# Patient Record
Sex: Female | Born: 1935 | Race: White | Hispanic: No | Smoking: Never smoker
Health system: Southern US, Community
[De-identification: ages and names within clinical notes are randomized; demographics above are authoritative.]

## PROBLEM LIST (undated history)

## (undated) DIAGNOSIS — I10 Essential (primary) hypertension: Secondary | ICD-10-CM

## (undated) DIAGNOSIS — E46 Unspecified protein-calorie malnutrition: Secondary | ICD-10-CM

## (undated) DIAGNOSIS — Z66 Do not resuscitate: Secondary | ICD-10-CM

## (undated) DIAGNOSIS — I639 Cerebral infarction, unspecified: Secondary | ICD-10-CM

## (undated) DIAGNOSIS — I4891 Unspecified atrial fibrillation: Secondary | ICD-10-CM

## (undated) HISTORY — PX: PEG TUBE PLACEMENT: SUR1034

---

## 2013-03-11 ENCOUNTER — Other Ambulatory Visit (HOSPITAL_COMMUNITY): Payer: Self-pay | Admitting: Geriatric Medicine

## 2013-03-11 DIAGNOSIS — R1310 Dysphagia, unspecified: Secondary | ICD-10-CM

## 2013-03-14 ENCOUNTER — Ambulatory Visit (HOSPITAL_COMMUNITY)
Admission: RE | Admit: 2013-03-14 | Discharge: 2013-03-14 | Disposition: A | Discharge status: Home / Self Care | Payer: Medicare Other | Source: Ambulatory Visit | Attending: Interventional Radiology | Admitting: Interventional Radiology

## 2013-03-14 ENCOUNTER — Other Ambulatory Visit (HOSPITAL_COMMUNITY): Payer: Self-pay | Admitting: Interventional Radiology

## 2013-03-14 ENCOUNTER — Other Ambulatory Visit: Payer: Self-pay | Admitting: Geriatric Medicine

## 2013-03-14 DIAGNOSIS — R4701 Aphasia: Secondary | ICD-10-CM

## 2013-03-14 DIAGNOSIS — Z431 Encounter for attention to gastrostomy: Secondary | ICD-10-CM | POA: Insufficient documentation

## 2013-03-14 MED ORDER — IOHEXOL 300 MG/ML  SOLN
50.0000 mL | Freq: Once | INTRAMUSCULAR | Status: AC | PRN
  Administered 2013-03-14: 1 mL

## 2013-03-15 ENCOUNTER — Ambulatory Visit (HOSPITAL_COMMUNITY)
Admission: RE | Admit: 2013-03-15 | Discharge: 2013-03-15 | Disposition: A | Discharge status: Home / Self Care | Payer: Medicare Other | Source: Ambulatory Visit | Attending: Geriatric Medicine | Admitting: Geriatric Medicine

## 2013-03-15 DIAGNOSIS — R1310 Dysphagia, unspecified: Secondary | ICD-10-CM

## 2013-03-15 DIAGNOSIS — Z8673 Personal history of transient ischemic attack (TIA), and cerebral infarction without residual deficits: Secondary | ICD-10-CM | POA: Insufficient documentation

## 2013-03-15 DIAGNOSIS — R1313 Dysphagia, pharyngeal phase: Secondary | ICD-10-CM | POA: Insufficient documentation

## 2013-03-15 DIAGNOSIS — R634 Abnormal weight loss: Secondary | ICD-10-CM | POA: Insufficient documentation

## 2013-03-15 DIAGNOSIS — F458 Other somatoform disorders: Secondary | ICD-10-CM | POA: Insufficient documentation

## 2013-03-15 DIAGNOSIS — K219 Gastro-esophageal reflux disease without esophagitis: Secondary | ICD-10-CM | POA: Insufficient documentation

## 2013-03-15 DIAGNOSIS — R1311 Dysphagia, oral phase: Secondary | ICD-10-CM | POA: Insufficient documentation

## 2013-03-15 NOTE — Procedures (Signed)
Objective Swallowing Evaluation: Modified Barium Swallowing Study  Patient Details  Name: Tamara Bailey MRN: [id number] Date of Birth: [birth year]  Today's Date: 03/15/2013 Time: 1100-1200 SLP Time Calculation (min): 60 min  Past Medical History: No past medical history on file. Past Surgical History: No past surgical history on file. HPI:  80 year old female referred for outpatient MBS due to difficulty swallowing.  Pt reports "I don't swallow good".  PMH significant for CVA, voice changes, vocal fold implants, PEG tube placement, reflux, weight loss, globus sensation.     Assessment / Plan / Recommendation Clinical Impression  Dysphagia Diagnosis: Severe pharyngeal phase dysphagia;Mild oral phase dysphagia;Severe cervical esophageal phase dysphagia Clinical impression: Mild oral, severe pharyngeal and cervical esophageal dysphagia, with both sensory and motor based components.  Pt oral phase is impaired only due to excessive saliva production, which consequently has an adverse impact on pharyngeal swallow.  Pt demonstrates delayed swallow reflex and minimal pharyngeal movement, regardless of consistency, resulting in significant residue throughout the pharynx.  There is markedly reduced CP relaxation, resulting in a minimal amount of bolus passing through to the esophagus.  Pt is then observed to penetrate and aspirate residue, and subsequently bring almost the entire bolus back up to the oral cavity.  Further compromising pt safety is the fact that her vocal fold adduction is inadequate for airway protection.  Per pt, a vocal fold implant was done a couple of years ago, and failed.  Suction was used to assist pt to remove oropharyngeal residue.  At this point, pt is NOT safe for po intake, and is recommended to use PEG tube for all nutrition and hydration.  Pt has lost weight, and so was encouraged to meet with MD or dietician to clarify tube feeding needs to avoid further weight loss.  Will  recommend SLP to follow up at facility to provide education, provide trial of strengthening exercises, and monitor any progress for appropriateness to repeat objective study.  At such time as objective study is warranted, FEES is recommended, given impairment of vocal fold function.    Treatment Recommendation  Defer treatment plan to SLP at (Comment) (pt's facility)    Diet Recommendation NPO;Alternative means - long-term   Medication Administration: Via alternative means    Other  Recommendations Oral Care Recommendations: Oral care Q4 per protocol Other Recommendations: Have oral suction available   Follow Up Recommendations  Outpatient SLP;Home health SLP (ST services recommended at pt's facility)    Frequency and Duration  per primary SLP     Pertinent Vitals/Pain VSS, no pain reported    SLP Swallow Goals     General Date of Onset: 03/15/13 HPI: 80 year old female referred for outpatient MBS due to difficulty swallowing.  Pt reports "I don't swallow good".  PMH significant for CVA, voice changes, vocal fold implants, PEG tube placement, reflux, weight loss, globus sensation. Type of Study: Modified Barium Swallowing Study Reason for Referral: Objectively evaluate swallowing function Previous Swallow Assessment: none found Diet Prior to this Study: Dysphagia 1 (puree);Thin liquids Temperature Spikes Noted: No Respiratory Status: Room air History of Recent Intubation: No Behavior/Cognition: Alert;Cooperative;Pleasant mood Oral Motor / Sensory Function: Within functional limits Self-Feeding Abilities: Needs assist Patient Positioning: Upright in chair Baseline Vocal Quality: Hoarse;Low vocal intensity Volitional Cough: Weak Volitional Swallow: Able to elicit Anatomy: Within functional limits Pharyngeal Secretions: Not observed secondary MBS    Reason for Referral Objectively evaluate swallowing function   Oral Phase Oral Preparation/Oral Phase Oral Phase: [facility]  Pharyngeal Phase Pharyngeal Phase Pharyngeal Phase: Impaired Pharyngeal - Honey Pharyngeal - Honey Teaspoon: Delayed swallow initiation;Premature spillage to valleculae;Reduced pharyngeal peristalsis;Reduced epiglottic inversion;Reduced anterior laryngeal mobility;Reduced laryngeal elevation;Reduced airway/laryngeal closure;Reduced tongue base retraction;Penetration/Aspiration during swallow;Penetration/Aspiration after swallow;Pharyngeal residue - valleculae;Pharyngeal residue - pyriform sinuses;Pharyngeal residue - posterior pharnyx Penetration/Aspiration details (honey teaspoon): Material enters airway, passes BELOW cords and not ejected out despite cough attempt by patient Pharyngeal - Thin Pharyngeal - Thin Teaspoon: Delayed swallow initiation;Premature spillage to valleculae;Reduced pharyngeal peristalsis;Reduced epiglottic inversion;Reduced anterior laryngeal mobility;Reduced laryngeal elevation;Reduced airway/laryngeal closure;Penetration/Aspiration after swallow;Penetration/Aspiration during swallow;Penetration/Aspiration before swallow;Reduced tongue base retraction;Pharyngeal residue - valleculae;Pharyngeal residue - pyriform sinuses;Pharyngeal residue - posterior pharnyx;Lateral channel residue Penetration/Aspiration details (thin teaspoon): Material enters airway, passes BELOW cords and not ejected out despite cough attempt by patient Pharyngeal - Solids Pharyngeal - Puree: Delayed swallow initiation;Premature spillage to valleculae;Reduced pharyngeal peristalsis;Reduced epiglottic inversion;Reduced anterior laryngeal mobility;Reduced laryngeal elevation;Reduced tongue base retraction;Pharyngeal residue - valleculae;Pharyngeal residue - pyriform sinuses;Reduced airway/laryngeal closure;Penetration/Aspiration after swallow;Pharyngeal residue - posterior pharnyx;Pharyngeal residue - cp segment;Inter-arytenoid space residue;Lateral channel residue Penetration/Aspiration details (puree):  Material enters airway, CONTACTS cords and not ejected out  Cervical Esophageal Phase    GO    Cervical Esophageal Phase Cervical Esophageal Phase: Impaired Cervical Esophageal Phase - Honey Honey Teaspoon: Prominent cricopharyngeal segment;Reduced cricopharyngeal relaxation Cervical Esophageal Phase - Thin Thin Teaspoon: Prominent cricopharyngeal segment;Reduced cricopharyngeal relaxation Cervical Esophageal Phase - Solids Puree: Prominent cricopharyngeal segment;Reduced cricopharyngeal relaxation        Tamara Bailey Tamara Bailey, Ccala CorpMSP, CCC-SLP [phone]  Leigh AuroraBueche, Tamara Bailey 03/15/2013, 12:38 PM

## 2013-03-16 ENCOUNTER — Other Ambulatory Visit: Payer: Self-pay | Admitting: Geriatric Medicine

## 2013-03-16 DIAGNOSIS — R4701 Aphasia: Secondary | ICD-10-CM

## 2013-03-17 ENCOUNTER — Other Ambulatory Visit (HOSPITAL_COMMUNITY): Payer: Self-pay | Admitting: Interventional Radiology

## 2013-03-22 ENCOUNTER — Other Ambulatory Visit: Payer: Self-pay | Admitting: Geriatric Medicine

## 2013-08-12 ENCOUNTER — Emergency Department (HOSPITAL_COMMUNITY): Payer: Medicare Other

## 2013-08-12 ENCOUNTER — Encounter (HOSPITAL_COMMUNITY): Payer: Self-pay | Admitting: Emergency Medicine

## 2013-08-12 ENCOUNTER — Inpatient Hospital Stay (HOSPITAL_COMMUNITY)
Admission: EM | Admit: 2013-08-12 | Discharge: 2013-08-16 | DRG: 480 | Disposition: A | Discharge status: Skilled Nursing Facility | Payer: Medicare Other | Attending: Internal Medicine | Admitting: Internal Medicine

## 2013-08-12 DIAGNOSIS — E43 Unspecified severe protein-calorie malnutrition: Secondary | ICD-10-CM | POA: Diagnosis present

## 2013-08-12 DIAGNOSIS — Z9221 Personal history of antineoplastic chemotherapy: Secondary | ICD-10-CM

## 2013-08-12 DIAGNOSIS — S72009A Fracture of unspecified part of neck of unspecified femur, initial encounter for closed fracture: Secondary | ICD-10-CM

## 2013-08-12 DIAGNOSIS — Y9210 Unspecified residential institution as the place of occurrence of the external cause: Secondary | ICD-10-CM | POA: Diagnosis present

## 2013-08-12 DIAGNOSIS — I482 Chronic atrial fibrillation, unspecified: Secondary | ICD-10-CM

## 2013-08-12 DIAGNOSIS — Z7901 Long term (current) use of anticoagulants: Secondary | ICD-10-CM

## 2013-08-12 DIAGNOSIS — I10 Essential (primary) hypertension: Secondary | ICD-10-CM | POA: Diagnosis present

## 2013-08-12 DIAGNOSIS — F99 Mental disorder, not otherwise specified: Secondary | ICD-10-CM | POA: Diagnosis not present

## 2013-08-12 DIAGNOSIS — R1310 Dysphagia, unspecified: Secondary | ICD-10-CM | POA: Diagnosis present

## 2013-08-12 DIAGNOSIS — Z681 Body mass index (BMI) 19 or less, adult: Secondary | ICD-10-CM

## 2013-08-12 DIAGNOSIS — E039 Hypothyroidism, unspecified: Secondary | ICD-10-CM | POA: Diagnosis present

## 2013-08-12 DIAGNOSIS — S72143A Displaced intertrochanteric fracture of unspecified femur, initial encounter for closed fracture: Principal | ICD-10-CM | POA: Diagnosis present

## 2013-08-12 DIAGNOSIS — Z931 Gastrostomy status: Secondary | ICD-10-CM

## 2013-08-12 DIAGNOSIS — D62 Acute posthemorrhagic anemia: Secondary | ICD-10-CM | POA: Diagnosis not present

## 2013-08-12 DIAGNOSIS — S72002A Fracture of unspecified part of neck of left femur, initial encounter for closed fracture: Secondary | ICD-10-CM

## 2013-08-12 DIAGNOSIS — I4891 Unspecified atrial fibrillation: Secondary | ICD-10-CM | POA: Diagnosis present

## 2013-08-12 DIAGNOSIS — Z853 Personal history of malignant neoplasm of breast: Secondary | ICD-10-CM

## 2013-08-12 DIAGNOSIS — Z885 Allergy status to narcotic agent status: Secondary | ICD-10-CM

## 2013-08-12 DIAGNOSIS — J3800 Paralysis of vocal cords and larynx, unspecified: Secondary | ICD-10-CM | POA: Diagnosis present

## 2013-08-12 DIAGNOSIS — Z8673 Personal history of transient ischemic attack (TIA), and cerebral infarction without residual deficits: Secondary | ICD-10-CM

## 2013-08-12 DIAGNOSIS — R64 Cachexia: Secondary | ICD-10-CM | POA: Diagnosis present

## 2013-08-12 DIAGNOSIS — Z79899 Other long term (current) drug therapy: Secondary | ICD-10-CM

## 2013-08-12 DIAGNOSIS — W19XXXA Unspecified fall, initial encounter: Secondary | ICD-10-CM

## 2013-08-12 DIAGNOSIS — S72002S Fracture of unspecified part of neck of left femur, sequela: Secondary | ICD-10-CM

## 2013-08-12 DIAGNOSIS — W010XXA Fall on same level from slipping, tripping and stumbling without subsequent striking against object, initial encounter: Secondary | ICD-10-CM | POA: Diagnosis present

## 2013-08-12 DIAGNOSIS — Z833 Family history of diabetes mellitus: Secondary | ICD-10-CM

## 2013-08-12 HISTORY — DX: Essential (primary) hypertension: I10

## 2013-08-12 HISTORY — DX: Unspecified atrial fibrillation: I48.91

## 2013-08-12 HISTORY — DX: Cerebral infarction, unspecified: I63.9

## 2013-08-12 MED ORDER — ACETAMINOPHEN 325 MG PO TABS: 650.0000 mg | ORAL_TABLET | Freq: Once | ORAL | Status: DC

## 2013-08-12 MED ORDER — SODIUM CHLORIDE 0.9 % IV SOLN
INTRAVENOUS | Status: DC
  Administered 2013-08-12: 125 mL/h via INTRAVENOUS
  Administered 2013-08-13 (×2): via INTRAVENOUS

## 2013-08-12 MED ORDER — FENTANYL CITRATE 0.05 MG/ML IJ SOLN
25.0000 ug | Freq: Once | INTRAMUSCULAR | Status: AC
  Administered 2013-08-12: 25 ug via INTRAVENOUS
  Filled 2013-08-12: qty 2

## 2013-08-12 MED ORDER — ACETAMINOPHEN 160 MG/5ML PO SOLN: 650.0000 mg | Freq: Once | ORAL | Status: DC

## 2013-08-12 NOTE — ED Notes (Signed)
[facility] EMS[facility] pt sts she tripped and fell, denies hitting her head, pt c/o L hip pain upon movement. A&Ox4. No deformity or crepitus. Minor pain to palpation. Pt on warfarin.

## 2013-08-12 NOTE — ED Provider Notes (Signed)
CSN: [id number]     Arrival date & time 08/12/13  2255 History   First MD Initiated Contact with Patient 08/12/13 2301     Chief Complaint  Patient presents with  . Fall  . Hip Pain     (Consider location/radiation/quality/duration/timing/severity/associated sxs/prior Treatment) HPI Comments: 80 year old female from nursing home presents after mechanical trip and fall causing her to hit her left hip. Patient is transferred down and missed part of the chair falling on her left side. Patient denies head injury and recalls details of events. Patient had a large sneeze prior to fall.  Patient is on Coumadin for long-term atrial fibrillation. No other injuries. Patient has significant pain with any range of motion. No history of hip fracture. Patient denies chest pain, lightheadedness, syncope or other new symptoms. Patient normally walks with a walker  Patient is a 80 y.o. female presenting with fall and hip pain. The history is provided by the patient and the EMS personnel.  Fall Pertinent negatives include no chest pain, no abdominal pain, no headaches and no shortness of breath.  Hip Pain Pertinent negatives include no chest pain, no abdominal pain, no headaches and no shortness of breath.    Past Medical History  Diagnosis Date  . Hypertension   . Stroke   . Atrial fibrillation    History reviewed. No pertinent past surgical history. No family history on file. History  Substance Use Topics  . Smoking status: Never Smoker   . Smokeless tobacco: Not on file  . Alcohol Use: No   OB History   Grav Para Term Preterm Abortions TAB SAB Ect Mult Living                 Review of Systems  Constitutional: Negative for fever and chills.  HENT: Negative for congestion.   Eyes: Negative for visual disturbance.  Respiratory: Negative for shortness of breath.   Cardiovascular: Negative for chest pain.  Gastrointestinal: Negative for vomiting and abdominal pain.  Genitourinary: Negative  for dysuria and flank pain.  Musculoskeletal: Positive for arthralgias and gait problem. Negative for back pain, neck pain and neck stiffness.  Skin: Negative for rash.  Neurological: Negative for light-headedness and headaches.      Allergies  Morphine and related  Home Medications   Prior to Admission medications   Not on File   BP 141/81  Pulse 110  Temp(Src) 97.9 F (36.6 C) (Oral)  Resp 16  SpO2 99% Physical Exam  Nursing note and vitals reviewed. Constitutional: She is oriented to person, place, and time. She appears well-developed and well-nourished.  HENT:  Head: Normocephalic and atraumatic.  Dry mucous membranes  Eyes: Conjunctivae are normal. Right eye exhibits no discharge. Left eye exhibits no discharge.  Neck: Normal range of motion. Neck supple. No tracheal deviation present.  Cardiovascular: An irregular rhythm present. Tachycardia present.   Murmur (2+ left systolic border systolic murmur) heard. Pulmonary/Chest: Effort normal and breath sounds normal.  Abdominal: Soft. She exhibits no distension. There is no tenderness. There is no guarding.  Musculoskeletal: She exhibits tenderness. She exhibits no edema.  No midline vertebral tenderness cervical thoracic or lumbar region. Full range of motion head and neck.  patient has lateral hip tenderness palpation significant tenderness with attempt to flex the hip. Mild shortening neurovascularly intact left lower leg  Neurological: She is alert and oriented to person, place, and time.  Skin: Skin is warm. No rash noted.  Psychiatric: She has a normal mood and affect.  ED Course  Procedures (including critical care time) Labs Review Labs Reviewed  BASIC METABOLIC PANEL - Abnormal; Notable for the following:    Potassium 3.6 (*)    Glucose, Bld 105 (*)    BUN 28 (*)    GFR calc non Af Amer 86 (*)    All other components within normal limits  CBC WITH DIFFERENTIAL - Abnormal; Notable for the following:     Hemoglobin 11.8 (*)    All other components within normal limits  PROTIME-INR - Abnormal; Notable for the following:    Prothrombin Time 24.7 (*)    INR 2.32 (*)    All other components within normal limits  URINALYSIS, ROUTINE W REFLEX MICROSCOPIC - Abnormal; Notable for the following:    APPearance TURBID (*)    All other components within normal limits  PROTIME-INR - Abnormal; Notable for the following:    Prothrombin Time 25.3 (*)    INR 2.39 (*)    All other components within normal limits  PROTIME-INR - Abnormal; Notable for the following:    Prothrombin Time 24.3 (*)    INR 2.27 (*)    All other components within normal limits  CBC - Abnormal; Notable for the following:    RBC 2.72 (*)    Hemoglobin 8.3 (*)    HCT 25.2 (*)    All other components within normal limits  BASIC METABOLIC PANEL - Abnormal; Notable for the following:    Potassium 3.6 (*)    Glucose, Bld 115 (*)    Creatinine, Ser 0.45 (*)    All other components within normal limits  PROTIME-INR - Abnormal; Notable for the following:    Prothrombin Time 17.3 (*)    All other components within normal limits  CBC - Abnormal; Notable for the following:    RBC 2.70 (*)    Hemoglobin 8.3 (*)    HCT 25.5 (*)    All other components within normal limits  BASIC METABOLIC PANEL - Abnormal; Notable for the following:    GFR calc non Af Amer 88 (*)    All other components within normal limits  PROTIME-INR - Abnormal; Notable for the following:    Prothrombin Time 23.3 (*)    INR 2.15 (*)    All other components within normal limits  CBC - Abnormal; Notable for the following:    RBC 2.68 (*)    Hemoglobin 8.2 (*)    HCT 25.1 (*)    All other components within normal limits  PROTIME-INR - Abnormal; Notable for the following:    Prothrombin Time 23.4 (*)    INR 2.16 (*)    All other components within normal limits  MRSA PCR SCREENING  URINE MICROSCOPIC-ADD ON  VITAMIN D 25 HYDROXY  TSH  PREPARE FRESH FROZEN  PLASMA  TYPE AND SCREEN  ABO/RH    Imaging Review No results found. Dg Chest 1 View  08/13/2013   CLINICAL DATA:  Status post fall.  Concern for chest injury.  EXAM: CHEST - 1 VIEW  COMPARISON:  None.  FINDINGS: The lungs are well-aerated. Likely chronic peribronchial thickening is noted. There is no evidence of focal opacification, pleural effusion or pneumothorax. Density at the medial right lung base is thought to reflect calcification of costochondral cartilage.  The cardiomediastinal silhouette is enlarged. No acute osseous abnormalities are seen. Scattered clips are seen overlying the right axilla.  IMPRESSION: 1. No displaced rib fracture seen. 2. Likely chronic peribronchial thickening noted; lungs otherwise clear. 3.  Cardiomegaly noted.   Electronically Signed   By: Roanna Raider M.D.   On: 08/13/2013 00:12   Dg Hip Complete Left  08/13/2013   CLINICAL DATA:  Status post fall.  Left hip pain.  EXAM: LEFT HIP - COMPLETE 2+ VIEW  COMPARISON:  None.  FINDINGS: There is a mildly comminuted intertrochanteric fracture through the proximal left femur, with a significantly displaced lesser trochanteric fragment. The left femoral head remains seated at the acetabulum. The right hip joint is unremarkable in appearance. No significant degenerative change is seen. Slight irregularity along the left inferior pubic ramus may reflect remote injury. The sacroiliac joints are unremarkable in appearance.  The visualized bowel gas pattern is grossly unremarkable.  IMPRESSION: Mildly comminuted intratrochanteric fracture through the proximal left femur, with a significantly displaced lesser trochanteric fragment.   Electronically Signed   By: Roanna Raider M.D.   On: 08/13/2013 00:11   Dg Hip Operative Left  08/15/2013   CLINICAL DATA:  Left hip fracture.  EXAM: OPERATIVE LEFT HIP; DG C-ARM 1-60 MIN - NRPT [facility]  COMPARISON:  August 12, 2013.  FINDINGS: Three intraoperative fluoroscopic images of the left hip were  submitted for review. These images demonstrate internal fixation of intertrochanteric fracture involving the proximal left femur. Good alignment of fracture components are noted.  IMPRESSION: Status post internal fixation of proximal left femoral intertrochanteric fracture.   Electronically Signed   By: Roque Lias M.D.   On: 08/15/2013 07:51   Dg Pelvis Portable  08/14/2013   CLINICAL DATA:  ORIF left femur fracture  EXAM: PORTABLE PELVIS 1-2 VIEWS  COMPARISON:  07/1913  FINDINGS: There is an intra medullary rod in the left femur addressing and intertrochanteric femur fracture. There is near anatomic position and alignment of fracture fragments with the exception of displaced lesser trochanter, which is displaced medially as seen on the prior study. Orthopedic hardware appears in anticipated position with anticipated postoperative change laterally over the left gluteal region.  IMPRESSION: ORIF left intertrochanteric femur fracture   Electronically Signed   By: Esperanza Heir M.D.   On: 08/14/2013 07:22   Dg C-arm 1-60 Min-no Report  08/15/2013   CLINICAL DATA:  Left hip fracture.  EXAM: OPERATIVE LEFT HIP; DG C-ARM 1-60 MIN - NRPT [facility]  COMPARISON:  August 12, 2013.  FINDINGS: Three intraoperative fluoroscopic images of the left hip were submitted for review. These images demonstrate internal fixation of intertrochanteric fracture involving the proximal left femur. Good alignment of fracture components are noted.  IMPRESSION: Status post internal fixation of proximal left femoral intertrochanteric fracture.   Electronically Signed   By: Roque Lias M.D.   On: 08/15/2013 07:51    EKG Interpretation   Date/Time:  Friday August 12 2013 23:18:36 EDT Ventricular Rate:  115 PR Interval:    QRS Duration: 125 QT Interval:  388 QTC Calculation: 537 R Axis:   111 Text Interpretation:  Atrial fibrillation Ventricular premature complex  Right bundle branch block Confirmed by RAY MD, Duwayne Heck [id number]) on   08/12/2013 11:27:52 PM      MDM   Final diagnoses:  Closed left hip fracture, initial encounter  Fall, initial encounter  Chronic atrial fibrillation   Patient with mechanical fall and left hip pain concern for new left hip fracture. Pain medicines ordered and basic labs, x-ray and EKG. Patient hit her fibrillation with mild RVR likely secondary to pain and mild dehydration. Spoke with triad hospitalist and orthopedics for admission for hip fracture.  Pain  controlled on recheck. The patients results and plan were reviewed and discussed.   Any x-rays performed were personally reviewed by myself.   Differential diagnosis were considered with the presenting HPI.  Medications  acetaminophen (TYLENOL) solution 650 mg (0 mg Per Tube Hold 08/13/13 0051)  ondansetron (ZOFRAN) injection 4 mg (not administered)  levothyroxine (SYNTHROID, LEVOTHROID) tablet 50 mcg (50 mcg Per Tube Given 08/15/13 1028)  fentaNYL (SUBLIMAZE) injection 25-50 mcg (not administered)  acetaminophen (TYLENOL) tablet 650 mg (650 mg Oral Given 08/16/13 0549)    Or  acetaminophen (TYLENOL) suppository 650 mg ( Rectal See Alternative 08/16/13 0549)  HYDROcodone-acetaminophen (NORCO/VICODIN) 5-325 MG per tablet 1-2 tablet (1 tablet Oral Given 08/14/13 0910)  ondansetron (ZOFRAN) tablet 4 mg ( Oral See Alternative 08/14/13 0530)    Or  ondansetron (ZOFRAN) injection 4 mg (4 mg Intravenous Given 08/14/13 0530)  metoCLOPramide (REGLAN) tablet 5-10 mg (not administered)    Or  metoCLOPramide (REGLAN) injection 5-10 mg (not administered)  menthol-cetylpyridinium (CEPACOL) lozenge 3 mg (not administered)    Or  phenol (CHLORASEPTIC) mouth spray 1 spray (not administered)  Warfarin - Pharmacist Dosing Inpatient ( Does not apply Duplicate 08/15/13 1800)  diltiazem (CARDIZEM) tablet 30 mg (30 mg Oral Given 08/16/13 0548)  feeding supplement (OSMOLITE 1.5 CAL) liquid 237 mL (237 mLs Per Tube New Bag/Given 08/16/13 0548)  fentaNYL  (SUBLIMAZE) injection 25 mcg (25 mcg Intravenous Given 08/12/13 2348)  0.9 %  sodium chloride infusion ( Intravenous New Bag/Given 08/13/13 0143)  phytonadione (VITAMIN K) tablet 5 mg (5 mg Oral Given 08/13/13 0232)  ceFAZolin (ANCEF) IVPB 2 g/50 mL premix (2 g Intravenous Given 08/14/13 1431)  warfarin (COUMADIN) tablet 5 mg (5 mg Oral Given 08/14/13 0911)  feeding supplement (OSMOLITE 1.5 CAL) liquid 237 mL (237 mLs Per Tube New Bag/Given 08/14/13 2140)  warfarin (COUMADIN) tablet 4 mg (4 mg Oral Given 08/15/13 1835)  diltiazem (CARDIZEM) injection 10 mg (10 mg Intravenous Given 08/15/13 1026)     Filed Vitals:   08/15/13 1324 08/15/13 2046 08/15/13 2314 08/16/13 0528  BP: 115/62 92/49 102/61 112/62  Pulse: 114 95  87  Temp: 100.1 F (37.8 C) 98.4 F (36.9 C)  99.4 F (37.4 C)  TempSrc: Oral Oral  Oral  Resp: 16 18  19   Height:      Weight:      SpO2: 100% 95%  100%    Admission/ observation were discussed with the admitting physician, patient and/or family and they are comfortable with the plan.       Enid SkeensJoshua M Zavitz, MD 08/16/13 [phone]

## 2013-08-12 NOTE — ED Notes (Signed)
Bed: WA21 Expected date:  Expected time:  Means of arrival:  Comments: EMS 80yo F, fall, left hip pain

## 2013-08-13 ENCOUNTER — Inpatient Hospital Stay (HOSPITAL_COMMUNITY): Payer: Medicare Other | Admitting: Anesthesiology

## 2013-08-13 ENCOUNTER — Encounter (HOSPITAL_COMMUNITY): Payer: Medicare Other | Admitting: Anesthesiology

## 2013-08-13 ENCOUNTER — Encounter (HOSPITAL_COMMUNITY): Payer: Self-pay | Admitting: Family Medicine

## 2013-08-13 ENCOUNTER — Encounter (HOSPITAL_COMMUNITY)
Admission: EM | Disposition: A | Discharge status: Skilled Nursing Facility | Payer: Self-pay | Source: Home / Self Care | Attending: Internal Medicine

## 2013-08-13 DIAGNOSIS — E43 Unspecified severe protein-calorie malnutrition: Secondary | ICD-10-CM

## 2013-08-13 DIAGNOSIS — S72002A Fracture of unspecified part of neck of left femur, initial encounter for closed fracture: Secondary | ICD-10-CM

## 2013-08-13 DIAGNOSIS — S72009S Fracture of unspecified part of neck of unspecified femur, sequela: Secondary | ICD-10-CM

## 2013-08-13 DIAGNOSIS — S72009A Fracture of unspecified part of neck of unspecified femur, initial encounter for closed fracture: Secondary | ICD-10-CM

## 2013-08-13 DIAGNOSIS — I4891 Unspecified atrial fibrillation: Secondary | ICD-10-CM

## 2013-08-13 HISTORY — PX: FEMUR IM NAIL: SHX1597

## 2013-08-13 LAB — URINALYSIS, ROUTINE W REFLEX MICROSCOPIC
Bilirubin Urine: NEGATIVE
Glucose, UA: NEGATIVE mg/dL
Hgb urine dipstick: NEGATIVE
KETONES UR: NEGATIVE mg/dL
LEUKOCYTES UA: NEGATIVE
NITRITE: NEGATIVE
Protein, ur: NEGATIVE mg/dL
SPECIFIC GRAVITY, URINE: 1.013 (ref 1.005–1.030)
UROBILINOGEN UA: 0.2 mg/dL (ref 0.0–1.0)
pH: 8 (ref 5.0–8.0)

## 2013-08-13 LAB — PROTIME-INR
INR: 2.32 — AB (ref 0.00–1.49)
INR: 2.39 — AB (ref 0.00–1.49)
INR: 2.27 — AB (ref 0.00–1.49)
PROTHROMBIN TIME: 24.7 s — AB (ref 11.6–15.2)
Prothrombin Time: 25.3 seconds — ABNORMAL HIGH (ref 11.6–15.2)
Prothrombin Time: 24.3 seconds — ABNORMAL HIGH (ref 11.6–15.2)

## 2013-08-13 LAB — URINE MICROSCOPIC-ADD ON

## 2013-08-13 LAB — CBC WITH DIFFERENTIAL/PLATELET
Basophils Absolute: 0 10*3/uL (ref 0.0–0.1)
Basophils Relative: 0 % (ref 0–1)
EOS ABS: 0 10*3/uL (ref 0.0–0.7)
Eosinophils Relative: 1 % (ref 0–5)
HCT: 36.2 % (ref 36.0–46.0)
HEMOGLOBIN: 11.8 g/dL — AB (ref 12.0–15.0)
LYMPHS ABS: 0.8 10*3/uL (ref 0.7–4.0)
Lymphocytes Relative: 20 % (ref 12–46)
MCH: 30.5 pg (ref 26.0–34.0)
MCHC: 32.6 g/dL (ref 30.0–36.0)
MCV: 93.5 fL (ref 78.0–100.0)
Monocytes Absolute: 0.3 10*3/uL (ref 0.1–1.0)
Monocytes Relative: 7 % (ref 3–12)
NEUTROS ABS: 3 10*3/uL (ref 1.7–7.7)
NEUTROS PCT: 72 % (ref 43–77)
PLATELETS: 209 10*3/uL (ref 150–400)
RBC: 3.87 MIL/uL (ref 3.87–5.11)
RDW: 14.4 % (ref 11.5–15.5)
WBC: 4.2 10*3/uL (ref 4.0–10.5)

## 2013-08-13 LAB — BASIC METABOLIC PANEL
BUN: 28 mg/dL — ABNORMAL HIGH (ref 6–23)
CALCIUM: 9.7 mg/dL (ref 8.4–10.5)
CO2: 30 mEq/L (ref 19–32)
Chloride: 98 mEq/L (ref 96–112)
Creatinine, Ser: 0.53 mg/dL (ref 0.50–1.10)
GFR calc Af Amer: >90 mL/min (ref >90)
GFR calc non Af Amer: 86 mL/min — ABNORMAL LOW (ref >90)
GLUCOSE: 105 mg/dL — AB (ref 70–99)
POTASSIUM: 3.6 meq/L — AB (ref 3.7–5.3)
Sodium: 141 mEq/L (ref 137–147)

## 2013-08-13 LAB — ABO/RH: ABO/RH(D): A POS

## 2013-08-13 LAB — MRSA PCR SCREENING: MRSA by PCR: NEGATIVE

## 2013-08-13 LAB — TYPE AND SCREEN
ABO/RH(D): A POS
Antibody Screen: NEGATIVE

## 2013-08-13 SURGERY — INSERTION, INTRAMEDULLARY ROD, FEMUR
Anesthesia: General | Laterality: Left

## 2013-08-13 MED ORDER — LEVOTHYROXINE SODIUM 50 MCG PO TABS
50.0000 ug | ORAL_TABLET | Freq: Every day | ORAL | Status: DC
  Administered 2013-08-13 – 2013-08-16 (×4): 50 ug
  Filled 2013-08-13 (×5): qty 1

## 2013-08-13 MED ORDER — ROCURONIUM BROMIDE 100 MG/10ML IV SOLN
INTRAVENOUS | Status: AC
  Filled 2013-08-13: qty 1

## 2013-08-13 MED ORDER — MORPHINE SULFATE 2 MG/ML IJ SOLN
0.5000 mg | INTRAMUSCULAR | Status: DC | PRN
  Administered 2013-08-13 (×2): 0.5 mg via INTRAVENOUS
  Filled 2013-08-13 (×2): qty 1

## 2013-08-13 MED ORDER — CARVEDILOL 6.25 MG PO TABS: 6.2500 mg | ORAL_TABLET | Freq: Three times a day (TID) | ORAL | Status: DC

## 2013-08-13 MED ORDER — PHENYLEPHRINE HCL 10 MG/ML IJ SOLN
INTRAMUSCULAR | Status: DC | PRN
  Administered 2013-08-13: 40 ug via INTRAVENOUS
  Administered 2013-08-13: 60 ug via INTRAVENOUS
  Administered 2013-08-13: 40 ug via INTRAVENOUS
  Administered 2013-08-13 (×2): 60 ug via INTRAVENOUS
  Administered 2013-08-13: 40 ug via INTRAVENOUS

## 2013-08-13 MED ORDER — PHYTONADIONE 5 MG PO TABS
5.0000 mg | ORAL_TABLET | Freq: Once | ORAL | Status: AC
  Administered 2013-08-13: 5 mg via ORAL
  Filled 2013-08-13: qty 1

## 2013-08-13 MED ORDER — LIDOCAINE HCL (CARDIAC) 20 MG/ML IV SOLN
INTRAVENOUS | Status: DC | PRN
  Administered 2013-08-13: 40 mg via INTRAVENOUS

## 2013-08-13 MED ORDER — PHENYLEPHRINE HCL 10 MG/ML IJ SOLN
INTRAMUSCULAR | Status: AC
  Filled 2013-08-13: qty 1

## 2013-08-13 MED ORDER — FENTANYL CITRATE 0.05 MG/ML IJ SOLN
INTRAMUSCULAR | Status: AC
  Filled 2013-08-13: qty 2

## 2013-08-13 MED ORDER — CEFAZOLIN SODIUM-DEXTROSE 2-3 GM-% IV SOLR
INTRAVENOUS | Status: DC | PRN
  Administered 2013-08-13: 2 g via INTRAVENOUS

## 2013-08-13 MED ORDER — METOPROLOL TARTRATE 50 MG PO TABS
50.0000 mg | ORAL_TABLET | Freq: Two times a day (BID) | ORAL | Status: DC
  Administered 2013-08-13 – 2013-08-14 (×2): 50 mg via ORAL
  Filled 2013-08-13 (×6): qty 1

## 2013-08-13 MED ORDER — ONDANSETRON HCL 4 MG/2ML IJ SOLN
INTRAMUSCULAR | Status: AC
  Filled 2013-08-13: qty 2

## 2013-08-13 MED ORDER — EPHEDRINE SULFATE 50 MG/ML IJ SOLN
INTRAMUSCULAR | Status: AC
  Filled 2013-08-13: qty 1

## 2013-08-13 MED ORDER — ONDANSETRON HCL 4 MG/2ML IJ SOLN: 4.0000 mg | Freq: Three times a day (TID) | INTRAMUSCULAR | Status: AC | PRN

## 2013-08-13 MED ORDER — FENTANYL CITRATE 0.05 MG/ML IJ SOLN
INTRAMUSCULAR | Status: DC | PRN
  Administered 2013-08-13 (×2): 25 ug via INTRAVENOUS

## 2013-08-13 MED ORDER — ETOMIDATE 2 MG/ML IV SOLN
INTRAVENOUS | Status: DC | PRN
  Administered 2013-08-13: 6 mg via INTRAVENOUS

## 2013-08-13 MED ORDER — LIDOCAINE HCL (CARDIAC) 20 MG/ML IV SOLN
INTRAVENOUS | Status: AC
  Filled 2013-08-13: qty 5

## 2013-08-13 MED ORDER — DILTIAZEM HCL 30 MG PO TABS
30.0000 mg | ORAL_TABLET | Freq: Four times a day (QID) | ORAL | Status: DC
  Administered 2013-08-13: 30 mg via ORAL
  Filled 2013-08-13 (×5): qty 1

## 2013-08-13 MED ORDER — ETOMIDATE 2 MG/ML IV SOLN
INTRAVENOUS | Status: AC
  Filled 2013-08-13: qty 10

## 2013-08-13 MED ORDER — SODIUM CHLORIDE 0.9 % IJ SOLN
INTRAMUSCULAR | Status: AC
  Filled 2013-08-13: qty 10

## 2013-08-13 MED ORDER — CEFAZOLIN SODIUM-DEXTROSE 2-3 GM-% IV SOLR
INTRAVENOUS | Status: AC
  Filled 2013-08-13: qty 50

## 2013-08-13 MED ORDER — PROPOFOL 10 MG/ML IV BOLUS
INTRAVENOUS | Status: DC | PRN
  Administered 2013-08-13: 50 mg via INTRAVENOUS

## 2013-08-13 MED ORDER — HYDROCODONE-ACETAMINOPHEN 5-325 MG PO TABS: 1.0000 | ORAL_TABLET | Freq: Four times a day (QID) | ORAL | Status: DC | PRN

## 2013-08-13 MED ORDER — SUCCINYLCHOLINE CHLORIDE 20 MG/ML IJ SOLN
INTRAMUSCULAR | Status: DC | PRN
  Administered 2013-08-13: 80 mg via INTRAVENOUS

## 2013-08-13 MED ORDER — LEVOTHYROXINE SODIUM 50 MCG PO TABS
50.0000 ug | ORAL_TABLET | Freq: Every day | ORAL | Status: DC
  Filled 2013-08-13 (×2): qty 1

## 2013-08-13 MED ORDER — DILTIAZEM HCL 30 MG PO TABS
30.0000 mg | ORAL_TABLET | Freq: Four times a day (QID) | ORAL | Status: DC
  Administered 2013-08-13: 30 mg via ORAL
  Filled 2013-08-13 (×6): qty 1

## 2013-08-13 MED ORDER — HYDROCODONE-ACETAMINOPHEN 5-325 MG PO TABS
1.0000 | ORAL_TABLET | Freq: Four times a day (QID) | ORAL | Status: DC | PRN
  Administered 2013-08-13: 1
  Filled 2013-08-13: qty 1

## 2013-08-13 MED ORDER — PROPOFOL 10 MG/ML IV BOLUS
INTRAVENOUS | Status: AC
  Filled 2013-08-13: qty 20

## 2013-08-13 MED ORDER — 0.9 % SODIUM CHLORIDE (POUR BTL) OPTIME
TOPICAL | Status: DC | PRN
  Administered 2013-08-13: 1000 mL

## 2013-08-13 MED ORDER — SODIUM CHLORIDE 0.9 % IV SOLN
INTRAVENOUS | Status: AC
  Administered 2013-08-13: 02:00:00 via INTRAVENOUS

## 2013-08-13 SURGICAL SUPPLY — 36 items
BAG ZIPLOCK 12X15 (MISCELLANEOUS) ×3 IMPLANT
BANDAGE ELASTIC 6 VELCRO ST LF (GAUZE/BANDAGES/DRESSINGS) IMPLANT
BLADE SURG 15 STRL LF DISP TIS (BLADE) ×1 IMPLANT
BLADE SURG 15 STRL SS (BLADE) ×2
BNDG GAUZE ELAST 4 BULKY (GAUZE/BANDAGES/DRESSINGS) ×3 IMPLANT
DRAPE STERI IOBAN 125X83 (DRAPES) ×3 IMPLANT
DRSG MEPILEX BORDER 4X4 (GAUZE/BANDAGES/DRESSINGS) ×3 IMPLANT
DRSG PAD ABDOMINAL 8X10 ST (GAUZE/BANDAGES/DRESSINGS) IMPLANT
DRSG TEGADERM 4X4.75 (GAUZE/BANDAGES/DRESSINGS) IMPLANT
DURAPREP 26ML APPLICATOR (WOUND CARE) ×3 IMPLANT
ELECT REM PT RETURN 9FT ADLT (ELECTROSURGICAL) ×3
ELECTRODE REM PT RTRN 9FT ADLT (ELECTROSURGICAL) ×1 IMPLANT
GAUZE SPONGE 4X4 12PLY STRL (GAUZE/BANDAGES/DRESSINGS) IMPLANT
GAUZE XEROFORM 1X8 LF (GAUZE/BANDAGES/DRESSINGS) ×3 IMPLANT
GLOVE SURG ORTHO 8.0 STRL STRW (GLOVE) ×3 IMPLANT
GOWN STRL REUS W/TWL LRG LVL3 (GOWN DISPOSABLE) ×3 IMPLANT
GUIDE PIN 3.2 LONG (PIN) ×3 IMPLANT
GUIDE PIN 3.2MM (MISCELLANEOUS) ×2
GUIDE PIN ORTH 343X3.2XBRAD (MISCELLANEOUS) ×1 IMPLANT
HIP SCREW SET (Screw) ×3 IMPLANT
KIT BASIN OR (CUSTOM PROCEDURE TRAY) ×3 IMPLANT
NAIL IMSH 10X36 (Nail) ×3 IMPLANT
PACK GENERAL/GYN (CUSTOM PROCEDURE TRAY) ×3 IMPLANT
PADDING CAST COTTON 6X4 STRL (CAST SUPPLIES) IMPLANT
POSITIONER SURGICAL ARM (MISCELLANEOUS) ×3 IMPLANT
SCREW COMPRESSION (Screw) ×3 IMPLANT
SCREW LAG 85MM (Screw) ×2 IMPLANT
SCREW LAGSTD 85X21X12.7X9 (Screw) ×1 IMPLANT
SPONGE LAP 4X18 X RAY DECT (DISPOSABLE) IMPLANT
STAPLER VISISTAT (STAPLE) ×3 IMPLANT
SUT ETHILON 3 0 PS 1 (SUTURE) ×3 IMPLANT
SUT VIC AB 0 CT1 27 (SUTURE) ×4
SUT VIC AB 0 CT1 27XBRD ANTBC (SUTURE) ×2 IMPLANT
SUT VIC AB 2-0 CT1 27 (SUTURE) ×4
SUT VIC AB 2-0 CT1 TAPERPNT 27 (SUTURE) ×2 IMPLANT
TOWEL OR 17X26 10 PK STRL BLUE (TOWEL DISPOSABLE) ×6 IMPLANT

## 2013-08-13 NOTE — H&P (Signed)
[facility] History and Physical  Tamara Bailey ZOX:[id number] DOB: 09/29/1929 DOA: 08/12/2013  Referring physician: ed PCP: Ginette OttoSTONEKING,HAL THOMAS, MD  Specialists: ortho  Chief Complaint: fall + hip #  HPI:  80 y/o White sont eresident?, knonw Afib, CHad2Vasc2 score=5,  CVA 2006, htn, breast ca s/p Cehmo and ? Lumpectomy, Peg tube for malnutirition admitted presented [facility]  SNF [facility] with h/o fall. SHe state s she was trying to sneeze ~ 11:00 opm and fell and hit her hip  NO loc, no n,v,cp,sob,f,chill rigor,rash Because eof apin she was brought over to Ed and found to have IC hip fracture  Also noted to be in Afib with Rates 120-130  Review of Systems:   Past Medical History  Diagnosis Date  . Hypertension   . Stroke   . Atrial fibrillation    History reviewed. No pertinent past surgical history. Social History:  History   Social History Narrative  . No narrative on file    Allergies  Allergen Reactions  . Morphine And Related     Family History  Problem Relation Age of Onset  . Diabetes        Prior to Admission medications   Not on File   Physical Exam: Filed Vitals:   08/12/13 2303 08/13/13 0030  BP: 141/81 142/76  Pulse: 110 118  Temp: 97.9 F (36.6 C)   TempSrc: Oral   Resp: 16 17  SpO2: 99% 96%     General:  EOMI, NCAt  Eyes: seeabove  ENT:  poor dentition  Neck: soft, supple no bruit  no JVD  Cardiovascular:  S1-S2 tachycardic atrial fibrillation  Respiratory:  Clinically clear  Abdomen:  Soft nontender nondistended PEG tube in place  Skin:  No lower extremity edema  Musculoskeletal:  Range of motion intact however left hip externally rotated  Psychiatric:  euthymic  Neurologic:  intact  Labs on Admission:  Basic Metabolic Panel:  Recent Labs Lab 08/12/13 2315  NA 141  K 3.6*  CL 98  CO2 30  GLUCOSE 105*  BUN 28*  CREATININE 0.53  CALCIUM 9.7   Liver Function Tests: No results found for this  basename: AST, ALT, ALKPHOS, BILITOT, PROT, ALBUMIN,  in the last 168 hours No results found for this basename: LIPASE, AMYLASE,  in the last 168 hours No results found for this basename: AMMONIA,  in the last 168 hours CBC:  Recent Labs Lab 08/12/13 2315  WBC 4.2  NEUTROABS 3.0  HGB 11.8*  HCT 36.2  MCV 93.5  PLT 209   Cardiac Enzymes: No results found for this basename: CKTOTAL, CKMB, CKMBINDEX, TROPONINI,  in the last 168 hours  BNP (last 3 results) No results found for this basename: PROBNP,  in the last 8760 hours CBG: No results found for this basename: GLUCAP,  in the last 168 hours  Radiological Exams on Admission: Dg Chest 1 View  08/13/2013   CLINICAL DATA:  Status post fall.  Concern for chest injury.  EXAM: CHEST - 1 VIEW  COMPARISON:  None.  FINDINGS: The lungs are well-aerated. Likely chronic peribronchial thickening is noted. There is no evidence of focal opacification, pleural effusion or pneumothorax. Density at the medial right lung base is thought to reflect calcification of costochondral cartilage.  The cardiomediastinal silhouette is enlarged. No acute osseous abnormalities are seen. Scattered clips are seen overlying the right axilla.  IMPRESSION: 1. No displaced rib fracture seen. 2. Likely chronic peribronchial thickening noted; lungs otherwise clear. 3.  Cardiomegaly noted.   Electronically Signed   By: Roanna RaiderJeffery  Chang M.D.   On: 08/13/2013 00:12   Dg Hip Complete Left  08/13/2013   CLINICAL DATA:  Status post fall.  Left hip pain.  EXAM: LEFT HIP - COMPLETE 2+ VIEW  COMPARISON:  None.  FINDINGS: There is a mildly comminuted intertrochanteric fracture through the proximal left femur, with a significantly displaced lesser trochanteric fragment. The left femoral head remains seated at the acetabulum. The right hip joint is unremarkable in appearance. No significant degenerative change is seen. Slight irregularity along the left inferior pubic ramus may reflect remote  injury. The sacroiliac joints are unremarkable in appearance.  The visualized bowel gas pattern is grossly unremarkable.  IMPRESSION: Mildly comminuted intratrochanteric fracture through the proximal left femur, with a significantly displaced lesser trochanteric fragment.   Electronically Signed   By: Roanna RaiderJeffery  Chang M.D.   On: 08/13/2013 00:11    EKG: Independently reviewed.  It should fibrillation with RVR  Assessment/Plan  1.  left hip fracture- patient at moderate risk for surgery as this is orthopedic surgery. She has no recent MI or  Major organ dysfunction therefore benefit to her risks. Her Coumadin will need to be reversed and have given her a small dose of vitamin K 5 mg and repeat INR in the morning. I will allow her clear liquids until she is seen by surgeon and decision was made to operate 2.   Atrial fibrillation with RVR. Have discontinued her Dyazide in favor of Cardizem 30 mg every 6 hours  And this can be increased to 60 mg if she still has difficult to rate control- she is stable at present time for monitoring on the telemetry unit- if needed cardiology be consulted 3.  history stroke 2006-currently stable 4.  history hypothyroidism-continue Synthroid 50 mcg every morning prior to breakfast 5. History breast cancer status post surgery-stable 6. History malnutrition. Hold PEG tube feeds for now. Investigate further.   The majority of her care has been more so she sees Dr. Pete GlatterStoneking  But used to see Dr. Harl FavorKoeler  Cardiology    45 minutes  discussed with family   Mahala MenghiniSAMTANI, [facility] Center[facility] Pager [phone]  If 7PM-7AM, please contact night-coverage [email or web address] Password [email or web address] 08/13/2013, 12:50 AM

## 2013-08-13 NOTE — Consult Note (Signed)
Reason for Consult: Left hip pain Referring Physician: Dr Oswaldo Milian Tamara Bailey is an 80 y.o. female.  HPI: Tamara Bailey is an 80 year old female who is a household ambulator with walker who was sitting down yesterday and missed the chair and fell on her left hip. She developed immediate pain and inability to weight-bear. She denies any other orthopedic complaints and denies any loss of consciousness. No family history of DVT or pulmonary embolism  Past Medical History  Diagnosis Date  . Hypertension   . Stroke   . Atrial fibrillation     History reviewed. No pertinent past surgical history.  Family History  Problem Relation Age of Onset  . Diabetes      Social History:  reports that she has never smoked. She does not have any smokeless tobacco history on file. She reports that she does not drink alcohol. Her drug history is not on file.  Allergies:  Allergies  Allergen Reactions  . Morphine And Related Nausea And Vomiting    Medications: I have reviewed the patient's current medications.  Results for orders placed during the hospital encounter of 08/12/13 (from the past 48 hour(s))  BASIC METABOLIC PANEL     Status: Abnormal   Collection Time    08/12/13 11:15 PM      Result Value Ref Range   Sodium 141  137 - 147 mEq/L   Potassium 3.6 (*) 3.7 - 5.3 mEq/L   Chloride 98  96 - 112 mEq/L   CO2 30  19 - 32 mEq/L   Glucose, Bld 105 (*) 70 - 99 mg/dL   BUN 28 (*) 6 - 23 mg/dL   Creatinine, Ser 0.53  0.50 - 1.10 mg/dL   Calcium 9.7  8.4 - 10.5 mg/dL   GFR calc non Af Amer 86 (*) >90 mL/min   GFR calc Af Amer >90  >90 mL/min   Comment: (NOTE)     The eGFR has been calculated using the CKD EPI equation.     This calculation has not been validated in all clinical situations.     eGFR's persistently <90 mL/min signify possible Chronic Kidney     Disease.  CBC WITH DIFFERENTIAL     Status: Abnormal   Collection Time    08/12/13 11:15 PM      Result Value Ref  Range   WBC 4.2  4.0 - 10.5 K/uL   RBC 3.87  3.87 - 5.11 MIL/uL   Hemoglobin 11.8 (*) 12.0 - 15.0 g/dL   HCT 36.2  36.0 - 46.0 %   MCV 93.5  78.0 - 100.0 fL   MCH 30.5  26.0 - 34.0 pg   MCHC 32.6  30.0 - 36.0 g/dL   RDW 14.4  11.5 - 15.5 %   Platelets 209  150 - 400 K/uL   Neutrophils Relative % 72  43 - 77 %   Neutro Abs 3.0  1.7 - 7.7 K/uL   Lymphocytes Relative 20  12 - 46 %   Lymphs Abs 0.8  0.7 - 4.0 K/uL   Monocytes Relative 7  3 - 12 %   Monocytes Absolute 0.3  0.1 - 1.0 K/uL   Eosinophils Relative 1  0 - 5 %   Eosinophils Absolute 0.0  0.0 - 0.7 K/uL   Basophils Relative 0  0 - 1 %   Basophils Absolute 0.0  0.0 - 0.1 K/uL  PROTIME-INR     Status: Abnormal   Collection Time  08/12/13 11:15 PM      Result Value Ref Range   Prothrombin Time 24.7 (*) 11.6 - 15.2 seconds   INR 2.32 (*) 0.00 - 1.49  URINALYSIS, ROUTINE W REFLEX MICROSCOPIC     Status: Abnormal   Collection Time    08/13/13 12:05 AM      Result Value Ref Range   Color, Urine YELLOW  YELLOW   APPearance TURBID (*) CLEAR   Specific Gravity, Urine 1.013  1.005 - 1.030   pH 8.0  5.0 - 8.0   Glucose, UA NEGATIVE  NEGATIVE mg/dL   Hgb urine dipstick NEGATIVE  NEGATIVE   Bilirubin Urine NEGATIVE  NEGATIVE   Ketones, ur NEGATIVE  NEGATIVE mg/dL   Protein, ur NEGATIVE  NEGATIVE mg/dL   Urobilinogen, UA 0.2  0.0 - 1.0 mg/dL   Nitrite NEGATIVE  NEGATIVE   Leukocytes, UA NEGATIVE  NEGATIVE  URINE MICROSCOPIC-ADD ON     Status: None   Collection Time    08/13/13 12:05 AM      Result Value Ref Range   WBC, UA 0-2  <3 WBC/hpf   Urine-Other AMORPHOUS URATES/PHOSPHATES    PROTIME-INR     Status: Abnormal   Collection Time    08/13/13  6:20 AM      Result Value Ref Range   Prothrombin Time 25.3 (*) 11.6 - 15.2 seconds   INR 2.39 (*) 0.00 - 1.49    Dg Chest 1 View  08/13/2013   CLINICAL DATA:  Status post fall.  Concern for chest injury.  EXAM: CHEST - 1 VIEW  COMPARISON:  None.  FINDINGS: The lungs are  well-aerated. Likely chronic peribronchial thickening is noted. There is no evidence of focal opacification, pleural effusion or pneumothorax. Density at the medial right lung base is thought to reflect calcification of costochondral cartilage.  The cardiomediastinal silhouette is enlarged. No acute osseous abnormalities are seen. Scattered clips are seen overlying the right axilla.  IMPRESSION: 1. No displaced rib fracture seen. 2. Likely chronic peribronchial thickening noted; lungs otherwise clear. 3. Cardiomegaly noted.   Electronically Signed   By: Garald Balding M.D.   On: 08/13/2013 00:12   Dg Hip Complete Left  08/13/2013   CLINICAL DATA:  Status post fall.  Left hip pain.  EXAM: LEFT HIP - COMPLETE 2+ VIEW  COMPARISON:  None.  FINDINGS: There is a mildly comminuted intertrochanteric fracture through the proximal left femur, with a significantly displaced lesser trochanteric fragment. The left femoral head remains seated at the acetabulum. The right hip joint is unremarkable in appearance. No significant degenerative change is seen. Slight irregularity along the left inferior pubic ramus may reflect remote injury. The sacroiliac joints are unremarkable in appearance.  The visualized bowel gas pattern is grossly unremarkable.  IMPRESSION: Mildly comminuted intratrochanteric fracture through the proximal left femur, with a significantly displaced lesser trochanteric fragment.   Electronically Signed   By: Garald Balding M.D.   On: 08/13/2013 00:11    Review of Systems  Constitutional: Negative.   HENT: Negative.   Eyes: Negative.   Respiratory: Negative.   Cardiovascular: Negative.   Gastrointestinal: Negative.   Genitourinary: Negative.   Musculoskeletal: Positive for joint pain.  Skin: Negative.   Neurological: Negative.   Endo/Heme/Allergies: Negative.   Psychiatric/Behavioral: Negative.    Blood pressure 107/60, pulse 90, temperature 98.2 F (36.8 C), temperature source Oral, resp.  rate 20, height 5' 2.5" (1.588 m), weight 42.7 kg (94 lb 2.2 oz), SpO2 99.00%. Physical Exam  Constitutional: She appears well-developed.  HENT:  Head: Normocephalic.  Eyes: Pupils are equal, round, and reactive to light.  Neck: Normal range of motion.  Cardiovascular: Normal rate.   Respiratory: Effort normal.  Neurological: She is alert.  Skin: Skin is warm.  Psychiatric: She has a normal mood and affect.   examination of the left hip demonstrates pain with range of motion good dorsiflexion plantarflexion of the ankle is intact pedal pulses intact bilaterally. No ankle crepitus with range of motion. No knee crepitus with range of motion. Bilateral upper extremities no shoulder elbow range of motion is intact. Grip strength 5 out of 5 bilaterally. Neck range of motion nontender.  Assessment/Plan: Impression is left hip fracture intertrochanteric in a patient who is a household angulate or with walker plan intramedullary hip screw fixation. Risk and benefits discussed with the patient including but not limited to infection nerve vessel damage incomplete healing. Nonweightbearing for partial weightbearing patient understands the risk and benefits including perioperative risks all questions answered the patient is on Coumadin for DVT and her INR is 2.39. This was checked last night. He would be safe to proceed with surgery once the INR drops below 2 which will be either tonight or tomorrow morning.  DEAN,GREGORY SCOTT 08/13/2013, 9:22 AM

## 2013-08-13 NOTE — ED Notes (Addendum)
[facility] sts that she sneezed while sitting down and fell off the chair. Pt denies hitting head, dizziness, LOC. Pt A&Ox4. Pt only c/o pain in L hip. Pt has shortening and rotation of L leg.  Pt has PEG tube placed last year due to difficulty swallowing. Pt eats some soft foods by mouth.

## 2013-08-13 NOTE — Progress Notes (Signed)
ANTICOAGULATION CONSULT NOTE - Initial Consult  Pharmacy Consult for Lovenox Indication: atrial fibrillation  Allergies  Allergen Reactions  . Morphine And Related     Patient Measurements:   Vital Signs: Temp: 97.9 F (36.6 C) (06/19 2303) Temp src: Oral (06/19 2303) BP: 142/83 mmHg (06/20 0100) Pulse Rate: 118 (06/20 0030)  Labs:  Recent Labs  08/12/13 2315  HGB 11.8*  HCT 36.2  PLT 209  LABPROT 24.7*  INR 2.32*  CREATININE 0.53    CrCl is unknown because there is no height on file for the current visit.   Medical History: Past Medical History  Diagnosis Date  . Hypertension   . Stroke   . Atrial fibrillation     Medications:  Scheduled:  . sodium chloride   Intravenous STAT  . acetaminophen (TYLENOL) oral liquid 160 mg/5 mL  650 mg Per Tube Once  . diltiazem  30 mg Oral 4 times per day  . levothyroxine  50 mcg Oral QAC breakfast  . phytonadione  5 mg Oral Once   Infusions:  . sodium chloride 125 mL/hr (08/12/13 2347)    Assessment:  80 yr old female on warfarin PTA for AFib.  S/P fall sustaining left hip fracture  Home warfarin regimen = 4mg  alternating with 5mg  every other day  INR (6/19) = 2.32 with last dose of warfarin (4mg ) taken on 6/19  Awaiting surgical imput regarding need to operate.  Patient given Vitamin K 5mg  po x 1 in anticipation of need for reversal of warfarin  Pharmacy consulted to dose Lovenox for AFib as bridge therapy, but asked to hold on ordering dose until surgery has seen patient  Goal of Therapy:   Monitor platelets by anticoagulation protocol: Yes   Plan:   F/U surgical plan for patient and appropriate start of Lovenox based on surgery and INR  F/U AM INR  Tavon Magnussen, Joselyn GlassmanLeann Trefz, PharmD 08/13/2013,1:49 AM

## 2013-08-13 NOTE — Transfer of Care (Signed)
Immediate Anesthesia Transfer of Care Note  Patient: Tamara LambertMary Ellen Bailey  Procedure(s) Performed: Procedure(s): INTRAMEDULLARY (IM) Hip Screw FEMORAL (Left)  Patient Location: PACU  Anesthesia Type:General  Level of Consciousness: awake, alert  and oriented  Airway & Oxygen Therapy: Patient Spontanous Breathing and Patient connected to face mask oxygen  Post-op Assessment: Report given to PACU RN and Post -op Vital signs reviewed and stable  Post vital signs: Reviewed and stable  Complications: No apparent anesthesia complications

## 2013-08-13 NOTE — Brief Op Note (Signed)
08/12/2013 - 08/13/2013  11:43 PM  PATIENT:  Gaylyn LambertMary Ellen Haack  80 y.o. female  PRE-OPERATIVE DIAGNOSIS:  left hip fracture  POST-OPERATIVE DIAGNOSIS:  Displaced left hip fracture  PROCEDURE:  Procedure(s): INTRAMEDULLARY (IM) Hip Screw FEMORAL  SURGEON:  Surgeon(s): Cammy CopaGregory Scott Dean, MD  ASSISTANT: none  ANESTHESIA:   general  EBL: 50 ml    Total I/O In: 246 [Blood:246] Out: 50 [Blood:50]  BLOOD ADMINISTERED: none  DRAINS: none   LOCAL MEDICATIONS USED:  none  SPECIMEN:  No Specimen  COUNTS:  YES  TOURNIQUET:  * No tourniquets in log *  DICTATION: .Other Dictation: Dictation Number [id number]  PLAN OF CARE: Admit to inpatient   PATIENT DISPOSITION:  PACU - hemodynamically stable

## 2013-08-13 NOTE — Anesthesia Preprocedure Evaluation (Addendum)
Anesthesia Evaluation  Patient identified by MRN, date of birth, ID band Patient awake    Reviewed: Allergy & Precautions, H&P , NPO status , Patient's Chart, lab work & pertinent test results  Airway Mallampati: II TM Distance: >3 FB Neck ROM: Full    Dental  (+) Missing, Poor Dentition,    Pulmonary neg pulmonary ROS,  breath sounds clear to auscultation  Pulmonary exam normal       Cardiovascular hypertension, Pt. on medications + dysrhythmias Atrial Fibrillation Rhythm:Regular Rate:Normal     Neuro/Psych Left upper extremity weakness. Vocal cord paralysis. CVA, Residual Symptoms negative psych ROS   GI/Hepatic negative GI ROS, Neg liver ROS,   Endo/Other  Hypothyroidism   Renal/GU negative Renal ROS  negative genitourinary   Musculoskeletal negative musculoskeletal ROS (+)   Abdominal   Peds negative pediatric ROS (+)  Hematology negative hematology ROS (+)   Anesthesia Other Findings   Reproductive/Obstetrics negative OB ROS                         Anesthesia Physical Anesthesia Plan  ASA: III  Anesthesia Plan: General   Post-op Pain Management:    Induction: Intravenous  Airway Management Planned: Oral ETT  Additional Equipment:   Intra-op Plan:   Post-operative Plan: Extubation in OR  Informed Consent: I have reviewed the patients History and Physical, chart, labs and discussed the procedure including the risks, benefits and alternatives for the proposed anesthesia with the patient or authorized representative who has indicated his/her understanding and acceptance.   Dental advisory given  Plan Discussed with: CRNA  Anesthesia Plan Comments:         Anesthesia Quick Evaluation

## 2013-08-13 NOTE — Progress Notes (Signed)
[facility] PROGRESS NOTE  Tamara Bailey UEA:[id number] DOB: 11/05/1929 DOA: 08/12/2013 PCP: Ginette OttoSTONEKING,HAL THOMAS, MD   Brief narrative 80 year old female with history of hypertension, A. fib on Coumadin, history of stroke, severe malnutrition who presented after a fall on her left hip  at home and sustained an intertrochanteric fracture of the left hip.  Assessment/Plan: Left hip fracture Monitor on telemetry. Pain control with when necessary Vicodin and IV morphine for severe pain. Supportive care with Tylenol and IV fluids. -Seen by orthopedic consult and plan on intramedullary hip screw fixation, either tonight or tomorrow morning if INR improves to less than 2. Patient did receive 5 mg vitamin K overnight. INR this morning is 2.39. -Will plan to check repeat INR this evening. -Patient will need skilled nursing facility postop.  A. fib Rate controlled on monitor. Patient is on Cardizem at home. I will switch her to metoprolol 50 mg twice a day to provide perioperative beta blockade. Coumadin held for surgery. Monitor INR this afternoon.  Malnutrition  has PEG tube and takes both PEG feeds and by mouth. Will request nutrition consult   Hypothyroidism  continue synthroid  History of CVA Stable.       Code Status: full code Family Communication: None at bedside. No contact information available in the system. Disposition Plan: needs SNF post op   Consultants:  Orthopedics: Dr. August Saucerean  Procedures:  IM nailing of left hip fracture once INR improved  Antibiotics:  None  HPI/Subjective: Patient reports left hip pain. Admission H&P reviewed  Objective: Filed Vitals:   08/13/13 0503  BP: 107/60  Pulse: 90  Temp: 98.2 F (36.8 C)  Resp: 20    Intake/Output Summary (Last 24 hours) at 08/13/13 1001 Last data filed at 08/13/13 0700  Gross per 24 hour  Intake 315.42 ml  Output      0 ml  Net 315.42 ml   Filed Weights   08/13/13 0146  Weight: 42.7  kg (94 lb 2.2 oz)    Exam:   General: Elderly cachectic female in no acute distress  HEENT: No pallor, moist oral mucosa  Chest: Clear to auscultation bilaterally, no added sounds  CVS: S1 and S2 is regular, 2/6 systolic murmur,   Abdomen: Soft, nontender, nondistended, bowel sounds present, PEG in place  Extremities: Limited ROM of left hip due to pain  CNS: AAO x3  *  Data Reviewed: Basic Metabolic Panel:  Recent Labs Lab 08/12/13 2315  NA 141  K 3.6*  CL 98  CO2 30  GLUCOSE 105*  BUN 28*  CREATININE 0.53  CALCIUM 9.7   Liver Function Tests: No results found for this basename: AST, ALT, ALKPHOS, BILITOT, PROT, ALBUMIN,  in the last 168 hours No results found for this basename: LIPASE, AMYLASE,  in the last 168 hours No results found for this basename: AMMONIA,  in the last 168 hours CBC:  Recent Labs Lab 08/12/13 2315  WBC 4.2  NEUTROABS 3.0  HGB 11.8*  HCT 36.2  MCV 93.5  PLT 209   Cardiac Enzymes: No results found for this basename: CKTOTAL, CKMB, CKMBINDEX, TROPONINI,  in the last 168 hours BNP (last 3 results) No results found for this basename: PROBNP,  in the last 8760 hours CBG: No results found for this basename: GLUCAP,  in the last 168 hours  No results found for this or any previous visit (from the past 240 hour(s)).   Studies: Dg Chest 1 View  08/13/2013   CLINICAL DATA:  Status post fall.  Concern for chest injury.  EXAM: CHEST - 1 VIEW  COMPARISON:  None.  FINDINGS: The lungs are well-aerated. Likely chronic peribronchial thickening is noted. There is no evidence of focal opacification, pleural effusion or pneumothorax. Density at the medial right lung base is thought to reflect calcification of costochondral cartilage.  The cardiomediastinal silhouette is enlarged. No acute osseous abnormalities are seen. Scattered clips are seen overlying the right axilla.  IMPRESSION: 1. No displaced rib fracture seen. 2. Likely chronic peribronchial  thickening noted; lungs otherwise clear. 3. Cardiomegaly noted.   Electronically Signed   By: Roanna RaiderJeffery  Chang M.D.   On: 08/13/2013 00:12   Dg Hip Complete Left  08/13/2013   CLINICAL DATA:  Status post fall.  Left hip pain.  EXAM: LEFT HIP - COMPLETE 2+ VIEW  COMPARISON:  None.  FINDINGS: There is a mildly comminuted intertrochanteric fracture through the proximal left femur, with a significantly displaced lesser trochanteric fragment. The left femoral head remains seated at the acetabulum. The right hip joint is unremarkable in appearance. No significant degenerative change is seen. Slight irregularity along the left inferior pubic ramus may reflect remote injury. The sacroiliac joints are unremarkable in appearance.  The visualized bowel gas pattern is grossly unremarkable.  IMPRESSION: Mildly comminuted intratrochanteric fracture through the proximal left femur, with a significantly displaced lesser trochanteric fragment.   Electronically Signed   By: Roanna RaiderJeffery  Chang M.D.   On: 08/13/2013 00:11    Scheduled Meds: . sodium chloride   Intravenous STAT  . acetaminophen (TYLENOL) oral liquid 160 mg/5 mL  650 mg Per Tube Once  . diltiazem  30 mg Oral 4 times per day  . levothyroxine  50 mcg Per Tube QAC breakfast   Continuous Infusions: . sodium chloride Stopped (08/13/13 0140)      Time spent: 25 minutes    DHUNGEL, NISHANT  [facility] Pager [phone] If 7PM-7AM, please contact night-coverage at [email or web address], password [email or web address] 08/13/2013, 10:01 AM  LOS: 1 day

## 2013-08-14 ENCOUNTER — Inpatient Hospital Stay (HOSPITAL_COMMUNITY): Payer: Medicare Other

## 2013-08-14 LAB — PREPARE FRESH FROZEN PLASMA
Unit division: 0
Unit division: 0

## 2013-08-14 LAB — BASIC METABOLIC PANEL
BUN: 21 mg/dL (ref 6–23)
CHLORIDE: 102 meq/L (ref 96–112)
CO2: 24 mEq/L (ref 19–32)
Calcium: 8.5 mg/dL (ref 8.4–10.5)
Creatinine, Ser: 0.45 mg/dL — ABNORMAL LOW (ref 0.50–1.10)
GFR calc Af Amer: >90 mL/min (ref >90)
GFR calc non Af Amer: >90 mL/min (ref >90)
Glucose, Bld: 115 mg/dL — ABNORMAL HIGH (ref 70–99)
Potassium: 3.6 mEq/L — ABNORMAL LOW (ref 3.7–5.3)
Sodium: 139 mEq/L (ref 137–147)

## 2013-08-14 LAB — CBC
HCT: 25.2 % — ABNORMAL LOW (ref 36.0–46.0)
HEMOGLOBIN: 8.3 g/dL — AB (ref 12.0–15.0)
MCH: 30.5 pg (ref 26.0–34.0)
MCHC: 32.9 g/dL (ref 30.0–36.0)
MCV: 92.6 fL (ref 78.0–100.0)
PLATELETS: 155 10*3/uL (ref 150–400)
RBC: 2.72 MIL/uL — ABNORMAL LOW (ref 3.87–5.11)
RDW: 14.6 % (ref 11.5–15.5)
WBC: 10 10*3/uL (ref 4.0–10.5)

## 2013-08-14 LAB — PROTIME-INR
INR: 1.45 (ref 0.00–1.49)
Prothrombin Time: 17.3 seconds — ABNORMAL HIGH (ref 11.6–15.2)

## 2013-08-14 MED ORDER — HYDROCODONE-ACETAMINOPHEN 5-325 MG PO TABS
1.0000 | ORAL_TABLET | Freq: Four times a day (QID) | ORAL | Status: DC | PRN
  Administered 2013-08-14 (×2): 1 via ORAL
  Filled 2013-08-14 (×2): qty 1

## 2013-08-14 MED ORDER — WARFARIN - PHARMACIST DOSING INPATIENT: Freq: Every day | Status: DC

## 2013-08-14 MED ORDER — OSMOLITE 1.5 CAL PO LIQD
300.0000 mL | Freq: Four times a day (QID) | ORAL | Status: DC
  Administered 2013-08-15 (×2): 300 mL
  Filled 2013-08-14 (×4): qty 474

## 2013-08-14 MED ORDER — ONDANSETRON HCL 4 MG/2ML IJ SOLN
4.0000 mg | Freq: Four times a day (QID) | INTRAMUSCULAR | Status: DC | PRN
  Administered 2013-08-14: 4 mg via INTRAVENOUS
  Filled 2013-08-14: qty 2

## 2013-08-14 MED ORDER — PHENOL 1.4 % MT LIQD
1.0000 | OROMUCOSAL | Status: DC | PRN
  Filled 2013-08-14: qty 177

## 2013-08-14 MED ORDER — CEFAZOLIN SODIUM-DEXTROSE 2-3 GM-% IV SOLR
2.0000 g | Freq: Three times a day (TID) | INTRAVENOUS | Status: AC
  Administered 2013-08-14 (×2): 2 g via INTRAVENOUS
  Filled 2013-08-14 (×2): qty 50

## 2013-08-14 MED ORDER — HYDROCODONE-ACETAMINOPHEN 5-325 MG PO TABS: 1.0000 | ORAL_TABLET | Freq: Four times a day (QID) | ORAL | Status: DC | PRN

## 2013-08-14 MED ORDER — WARFARIN SODIUM 5 MG PO TABS
5.0000 mg | ORAL_TABLET | Freq: Once | ORAL | Status: AC
  Administered 2013-08-14: 5 mg via ORAL
  Filled 2013-08-14: qty 1

## 2013-08-14 MED ORDER — MENTHOL 3 MG MT LOZG
1.0000 | LOZENGE | OROMUCOSAL | Status: DC | PRN
  Filled 2013-08-14: qty 9

## 2013-08-14 MED ORDER — OSMOLITE 1.5 CAL PO LIQD
237.0000 mL | Freq: Three times a day (TID) | ORAL | Status: AC
  Administered 2013-08-14 (×2): 237 mL
  Filled 2013-08-14 (×2): qty 237

## 2013-08-14 MED ORDER — ENOXAPARIN SODIUM 30 MG/0.3ML ~~LOC~~ SOLN: 30.0000 mg | SUBCUTANEOUS | Status: DC

## 2013-08-14 MED ORDER — FENTANYL CITRATE 0.05 MG/ML IJ SOLN: 25.0000 ug | INTRAMUSCULAR | Status: DC | PRN

## 2013-08-14 MED ORDER — METOCLOPRAMIDE HCL 5 MG/ML IJ SOLN: 5.0000 mg | Freq: Three times a day (TID) | INTRAMUSCULAR | Status: DC | PRN

## 2013-08-14 MED ORDER — SODIUM CHLORIDE 0.9 % IV SOLN: INTRAVENOUS | Status: DC

## 2013-08-14 MED ORDER — ACETAMINOPHEN 650 MG RE SUPP: 650.0000 mg | Freq: Four times a day (QID) | RECTAL | Status: DC | PRN

## 2013-08-14 MED ORDER — ACETAMINOPHEN 325 MG PO TABS
650.0000 mg | ORAL_TABLET | Freq: Four times a day (QID) | ORAL | Status: DC | PRN
  Administered 2013-08-15 – 2013-08-16 (×2): 650 mg via ORAL
  Filled 2013-08-14 (×2): qty 2

## 2013-08-14 MED ORDER — METOCLOPRAMIDE HCL 10 MG PO TABS: 5.0000 mg | ORAL_TABLET | Freq: Three times a day (TID) | ORAL | Status: DC | PRN

## 2013-08-14 MED ORDER — ENOXAPARIN SODIUM 30 MG/0.3ML ~~LOC~~ SOLN
30.0000 mg | SUBCUTANEOUS | Status: DC
  Administered 2013-08-14: 30 mg via SUBCUTANEOUS
  Filled 2013-08-14 (×2): qty 0.3

## 2013-08-14 MED ORDER — OSMOLITE 1.5 CAL PO LIQD
120.0000 mL | Freq: Once | ORAL | Status: DC
  Filled 2013-08-14: qty 237

## 2013-08-14 MED ORDER — ONDANSETRON HCL 4 MG PO TABS: 4.0000 mg | ORAL_TABLET | Freq: Four times a day (QID) | ORAL | Status: DC | PRN

## 2013-08-14 MED ORDER — POTASSIUM CHLORIDE IN NACL 20-0.9 MEQ/L-% IV SOLN
INTRAVENOUS | Status: DC
  Administered 2013-08-14 – 2013-08-15 (×3): via INTRAVENOUS
  Filled 2013-08-14 (×4): qty 1000

## 2013-08-14 NOTE — Progress Notes (Addendum)
ANTICOAGULATION CONSULT NOTE - Initial Consult  Pharmacy Consult for Warfarin Indication: AFib  Allergies  Allergen Reactions  . Morphine And Related Nausea And Vomiting    Patient Measurements: Height: 5' 2.5" (158.8 cm) Weight: 94 lb 2.2 oz (42.7 kg) IBW/kg (Calculated) : 51.25  Vital Signs: Temp: 98.2 F (36.8 C) (06/21 0104) Temp src: Oral (06/20 2125) BP: 127/72 mmHg (06/21 0104) Pulse Rate: 99 (06/21 0045)  Labs:  Recent Labs  08/12/13 2315 08/13/13 0620 08/13/13 1700  HGB 11.8*  --   --   HCT 36.2  --   --   PLT 209  --   --   LABPROT 24.7* 25.3* 24.3*  INR 2.32* 2.39* 2.27*  CREATININE 0.53  --   --     Estimated Creatinine Clearance: 36.5 ml/min (by C-G formula based on Cr of 0.53).   Medical History: Past Medical History  Diagnosis Date  . Hypertension   . Stroke   . Atrial fibrillation     Medications:  Scheduled:  . acetaminophen (TYLENOL) oral liquid 160 mg/5 mL  650 mg Per Tube Once  .  ceFAZolin (ANCEF) IV  2 g Intravenous Q8H  . enoxaparin (LOVENOX) injection  30 mg Subcutaneous Q24H  . levothyroxine  50 mcg Per Tube QAC breakfast  . metoprolol tartrate  50 mg Oral BID   Infusions:  . 0.9 % NaCl with KCl 20 mEq / L 75 mL/hr at 08/14/13 0150    Assessment:  [age] yr female admitted yesterday with hip fracture s/p fall.  Pt now s/p repair of fracture  PTA pt on Warfarin 4mg  alternating with 5mg  every other day.  INR was therapeutic upon admission on this regimen.  Patient received Vitamin K 5mg  PO x 1 @ 02:32 on 6/20 in preparation of anticipated surgery  INR 6/21 = 1.45  pharmacy asked to dose warfarin now that patient is post-op  Goal of Therapy:  INR 2-3   Plan:   Ortho MD has ordered Lovenox 30mg  daily until INR > 1.8  Warfarin 5mg  po x 1 today  F/U AM INR  Poindexter, Joselyn GlassmanLeann Trefz, PharmD 08/14/2013,2:00 AM

## 2013-08-14 NOTE — Progress Notes (Signed)
OT Cancellation Note  Patient Details Name: Tamara Bailey MRN: [id number] DOB: 09/21/1929   Cancelled Treatment:    Reason Eval/Treat Not Completed: Other (comment) Pt is Medicare/Medicaid and current D/C plan is SNF. No apparent immediate acute care OT needs, therefore will defer OT to SNF. If OT eval is needed please call Acute Rehab Dept. at [phone] or text page OT at [phone].  [facility]oreWARD,HILLARY Hilary Ward, OTR/L  [phone] 08/14/2013 08/14/2013, 7:57 AM

## 2013-08-14 NOTE — Evaluation (Signed)
Physical Therapy Evaluation Patient Details Name: Tamara Bailey MRN: [id number] DOB: 08/29/1929 Today's Date: 08/14/2013   History of Present Illness  80 year old female with history of hypertension, atrial fib on Coumadin,CVA, severe malnutrition admitted 6/19 for fall on her left hip at home and sustained an intertrochanteric fracture of the left hip, s/p L IM nail.  Clinical Impression  Patient is s/p L IM nail surgery resulting in functional limitations due to the deficits listed below (see PT Problem List).  Patient will benefit from skilled PT to increase their independence and safety with mobility to allow discharge to the venue listed below.  Pt with decreased cognition today and slightly agitated but willing to participate.  Pt reports she wants to d/c home however recommended SNF to pt and pt's sister.  Pt and [facility]  Pt nauseated after session so returned to bed with cool cloth to forehead and pan.     Follow Up Recommendations SNF    Equipment Recommendations  None recommended by PT    Recommendations for Other Services       Precautions / Restrictions Precautions Precautions: Fall Restrictions Weight Bearing Restrictions: Yes LLE Weight Bearing: Non weight bearing      Mobility  Bed Mobility Overal bed mobility: Needs Assistance Bed Mobility: Supine to Sit;Sit to Supine     Supine to sit: Supervision;HOB elevated Sit to supine: Min assist   General bed mobility comments: verbal cues for technique, pt elevated HOB to get to sitting, assist for L LE onto bed, dizzy upon sitting and waited to resolve  Transfers Overall transfer level: Needs assistance Equipment used: Rolling walker (2 wheeled) Transfers: Sit to/from Stand Sit to Stand: Min assist         General transfer comment: verbal cues for technique and max cues for NWB, pt mostly leaning toward R side and resting L LE on floor however able to lift off  floor with cue to make sure pt remaining NWB  Ambulation/Gait Ambulation/Gait assistance:  (deferred for safety due to NWB and cognition)              Stairs            Wheelchair Mobility    Modified Rankin (Stroke Patients Only)       Balance                                             Pertinent Vitals/Pain Pt denies pain with mobility, repositioned    Home Living Family/patient expects to be discharged to:: Skilled nursing facility Living Arrangements: Alone   Type of Home: Independent living facility         Home Equipment: Walker - 2 wheels      Prior Function Level of Independence: Independent with assistive device(s)               Hand Dominance        Extremity/Trunk Assessment               Lower Extremity Assessment: LLE deficits/detail   LLE Deficits / Details: able to move against gravity, maintained NWB     Communication   Communication: No difficulties  Cognition Arousal/Alertness: Awake/alert Behavior During Therapy: Agitated (at times) Overall Cognitive Status: Impaired/Different from baseline Area of Impairment: Memory;Safety/judgement     Memory: Decreased recall of precautions;Decreased short-term  memory   Safety/Judgement: Decreased awareness of safety;Decreased awareness of deficits     General Comments: per RN and sister pt with decreased cognition and increased agitation post surgery    General Comments      Exercises        Assessment/Plan    PT Assessment Patient needs continued PT services  PT Diagnosis Difficulty walking   PT Problem List Decreased strength;Decreased mobility;Decreased knowledge of precautions;Decreased safety awareness;Decreased cognition;Decreased balance  PT Treatment Interventions DME instruction;Gait training;Functional mobility training;Therapeutic activities;Therapeutic exercise;Patient/family education;Wheelchair mobility training   PT Goals  (Current goals can be found in the Care Plan section) Acute Rehab PT Goals PT Goal Formulation: With patient/family Time For Goal Achievement: 08/21/13 Potential to Achieve Goals: Good    Frequency Min 3X/week   Barriers to discharge        Co-evaluation               End of Session   Activity Tolerance: Patient tolerated treatment well Patient left: in bed;with call bell/phone within reach;with family/visitor present Nurse Communication: Mobility status;Weight bearing status         Time: [id number] PT Time Calculation (min): 18 min   Charges:   PT Evaluation $Initial PT Evaluation Tier I: 1 Procedure PT Treatments $Therapeutic Activity: 8-22 mins   PT G Codes:          LEMYRE,KATHrine E 08/14/2013, 2:10 PM Zenovia JarredKati Lemyre, PT, DPT 08/14/2013 Pager: [phone]

## 2013-08-14 NOTE — Progress Notes (Signed)
Patient is non compliant. Patient refuses to answer questions.  Patient asked by Clinical research associatewriter what year it is and where patient is at and patient responds " I will not answer your questions, if you don't know then I am not going to tell you."  Patient keeps pointing and asking writer to " look in those boxes."  Writer tells patient there are no boxes and patient becomes angry and yells " I know the boxes are there, they have my food in them."  Will continue to monitor patient.

## 2013-08-14 NOTE — Progress Notes (Signed)
Subjective: Pt stable - was oob today   Objective: Vital signs in last 24 hours: Temp:  [97.9 F (36.6 C)-100.8 F (38.2 C)] 97.9 F (36.6 C) (06/21 1515) Pulse Rate:  [94-112] 110 (06/21 0520) Resp:  [14-20] 20 (06/21 1600) BP: (106-199)/(55-86) 120/86 mmHg (06/21 1515) SpO2:  [94 %-100 %] 94 % (06/21 1600)  Intake/Output from previous day: 06/20 0701 - 06/21 0700 In: 1767.3 [I.V.:1261.3; Blood:246; IV Piggyback:50] Out: 835 [Urine:785; Blood:50] Intake/Output this shift:    Exam:  Sensation intact distally Intact pulses distally Dorsiflexion/Plantar flexion intact  Labs:  Recent Labs  08/12/13 2315 08/14/13 0433  HGB 11.8* 8.3*    Recent Labs  08/12/13 2315 08/14/13 0433  WBC 4.2 10.0  RBC 3.87 2.72*  HCT 36.2 25.2*  PLT 209 155    Recent Labs  08/12/13 2315 08/14/13 0433  NA 141 139  K 3.6* 3.6*  CL 98 102  CO2 30 24  BUN 28* 21  CREATININE 0.53 0.45*  GLUCOSE 105* 115*  CALCIUM 9.7 8.5    Recent Labs  08/13/13 1700 08/14/13 0555  INR 2.27* 1.45    Assessment/Plan: Pt doing well - decreased pain with rom left leg - ok from ortho standpoint to dc to snf am - on lovenox and coumadin - ok for tdwb for transfers only  DEAN,GREGORY SCOTT 08/14/2013, 8:18 PM

## 2013-08-14 NOTE — Progress Notes (Addendum)
INITIAL NUTRITION ASSESSMENT  DOCUMENTATION CODES Per approved criteria  -Severe malnutrition in the context of chronic illness -Underweight   INTERVENTION:  Osmolite 1.5 bolus via PEG, begin with 120 ml, increase as tolerated this afternoon and evening to 237 ml. Then increase to goal in am of 300 ml 4 times daily to provide:  1800 kcal, 75 gm protein, and 912 ml free water.  Will need an additional 800 ml of free water when IVF d/c'ed  Rec NPO (per SLP MBS 03/15/13 with note in e-chart)  Discussed with MD and RN.  Consider consult SLP to re evaluate swallowing function.  RD to monitor.  NUTRITION DIAGNOSIS: Inadequate oral intake related to dysphagia as evidenced by documentation and verbalization.   Goal: Patient to meet >90% estimated needs with enteral feeding via PEG.  Monitor:  TF tolerance and adequacy, weight trend, labs.  Reason for Assessment: Malnutrition, TF Management  80 y.o. female  Admitting Dx: <principal problem not specified>  ASSESSMENT: Patient admitted s/p hip fracture s/p surgery 6/20.    [facility]  [facility]  Patient s/p MBS 02/2013 with recs for NPO.  Patient refused.  [facility] faxed the Refusal of Medical [facility] which stated that patient refused to follow recommended advice and was receiving a Pureed diet.  TF orders were for 2 cal HN 1 can QID (1900 kcal, 80 gm protein, 644 ml free water daily).  Refusal of Medical Treatment form and [facility] were place in paper chart here.  Spoke with patient's sister who reported patient was only eating about  1/10th of the pureed diet.  Reported that prior to [facility], patient would give part of her TF to her cat.  Question how compliant patient was with TF prior to admit.  Patient underweight, thin and frail  looking.  Nutrition Focussed Physical Exam deferred at this time due to patient's confusion and aggravation with staff.  Patient with decreased body fat and muscle mass.  Patient meets criteria for malnutrition related to chronic illness AEB depletion of body fat and muscle mass.  Height: Ht Readings from Last 1 Encounters:  08/13/13 5' 2.5" (1.588 m)    Weight: Wt Readings from Last 1 Encounters:  08/13/13 94 lb 2.2 oz (42.7 kg)    Ideal Body Weight: 113 lbs  % Ideal Body Weight: 83  Wt Readings from Last 10 Encounters:  08/13/13 94 lb 2.2 oz (42.7 kg)  08/13/13 94 lb 2.2 oz (42.7 kg)    Usual Body Weight: unknown  % Usual Body Weight: unknown  BMI:  Body mass index is 16.93 kg/(m^2).  Estimated Nutritional Needs: Kcal: 1700-1900 kcal Protein: 65-75 gm protein Fluid: >1.7L daily  Skin: intact  Diet Order: General  EDUCATION NEEDS: -No education needs identified at this time   Intake/Output Summary (Last 24 hours) at 08/14/13 1107 Last data filed at 08/14/13 0932  Gross per 24 hour  Intake 1727.25 ml  Output    785 ml  Net 942.25 ml     Labs:   Recent Labs Lab 08/12/13 2315 08/14/13 0433  NA 141 139  K 3.6* 3.6*  CL 98 102  CO2 30 24  BUN 28* 21  CREATININE 0.53 0.45*  CALCIUM 9.7 8.5  GLUCOSE 105* 115*    CBG (last 3)  No results found  for this basename: GLUCAP,  in the last 72 hours  Scheduled Meds: . acetaminophen (TYLENOL) oral liquid 160 mg/5 mL  650 mg Per Tube Once  .  ceFAZolin (ANCEF) IV  2 g Intravenous Q8H  . enoxaparin (LOVENOX) injection  30 mg Subcutaneous Q24H  . levothyroxine  50 mcg Per Tube QAC breakfast  . metoprolol tartrate  50 mg Oral BID  . Warfarin - Pharmacist Dosing Inpatient   Does not apply q1800    Continuous Infusions: . 0.9 % NaCl with KCl 20 mEq / L 75 mL/hr at 08/14/13 0150    Past Medical History  Diagnosis Date  . Hypertension   . Stroke   . Atrial fibrillation     History reviewed. No  pertinent past surgical history.  Oran ReinLaura Jobe, RD, LDN Clinical Inpatient Dietitian Pager:  [phone] Weekend and after hours pager:  [phone]

## 2013-08-14 NOTE — Progress Notes (Signed)
Patient's sister at bedside upon entering room.  Writer informed patient it was time for her bolus peg tube feeding.  Patient begins shaking her head and mouthing "No".  Writer asked patient if she wanted to receive tube feeding and patient said "No, I do not want that now."  Patient's sister told writer "do not ask her (referring to the patient) anything and next time just give her the food and her medicine."  Writer informed patient's sister that it was patient's right to refuse food, a medication, or treatment.  Patient's sister stated "well my license is expired so I will give it to her."  Writer informed patient's sister that the staff would give out the medications and not to bring any medications or tube feeding from outside our facility.  Patient's sister verbalized understanding and stated that she would not do that.  Will continue to monitor patient.

## 2013-08-14 NOTE — Op Note (Signed)
NAMDawayne Cirri:  Corso, Jessika                ACCOUNT NO.:  [id number]  MEDICAL RECORD NO.:  [id number]  LOCATION:  1413                         [facility]  PHYSICIAN:  Burnard BuntingG. Scott Dean, M.D.    DATE OF BIRTH:  1929/07/16  DATE OF PROCEDURE:  08/13/2013 DATE OF DISCHARGE:                              OPERATIVE REPORT   PREOPERATIVE DIAGNOSIS:  Left hip intertrochanteric fracture.  POSTOPERATIVE DIAGNOSIS:  Left hip intertrochanteric fracture.  PROCEDURE:  Left hip intertrochanteric fracture fixation with intramedullary hip screw from [employer] 10 x 36 with an 85 mm lag screw.  SURGEON:  Burnard BuntingG. Scott Dean, M.D.  ASSISTANT:  None.  ANESTHESIA:  General.  ESTIMATED BLOOD LOSS:  50 mL.  INDICATIONS:  [name] is an 80 year old patient who fell, has a left hip fracture, presents for operative management after explanation of risks and benefits of the procedure in detail.  PROCEDURE IN DETAIL:  The patient was brought to the operating room where general endotracheal anesthesia was used.  Preoperative antibiotics were administered.  Time-out was called.  Left leg was prescrubbed with alcohol and Betadine, which was allowed to air dry, prepped with DuraPrep solution and draped in sterile manner.  Collier Flowersoban was used to cover the operative side.  Right leg was positioned in lithotomy position with peroneal nerve well padded.  At this time, fluoroscopic guidance was used to localize the tip of the trochanter.  Fracture was reduced with traction and internal rotation.  Good reduction was achieved.  A small incision was placed 4 fingerbreadths proximal to the tip of the trochanter in line with the femur.  Guide pin was placed into the tip of the trochanter.  Proximal reaming performed in accordance with preoperative templating, a 10 x 36 nail was then placed across the fracture site into the femur.  Correct location confirmed with the AP and lateral planes.  An 85 mm lag screw was then placed  through a separate incision across the fracture and through the intramedullary hip nail.  Compression screw was then applied to the lag screw which gave excellent compression and the traction was released.  At this time, the fracture was well reduced.  There was a lesser tuberosity fragment piece which did not reduce that had the iliopsoas tendon attached.  All incisions were then irrigated and closed using 0 Vicryl suture and staples and Mepilex.  The patient tolerated the procedure well without immediate complications.  Transferred to recovery room in stable condition.     Burnard BuntingG. Scott Dean, M.D.     GSD/MEDQ  D:  08/13/2013  T:  08/14/2013  Job:  [id number]

## 2013-08-14 NOTE — Progress Notes (Signed)
[facility] PROGRESS NOTE  Tamara Bailey QMV:[id number] DOB: 10/03/1929 DOA: 08/12/2013 PCP: Ginette OttoSTONEKING,HAL THOMAS, MD   Brief narrative 80 year old female with history of hypertension, A. fib on Coumadin, history of stroke, severe malnutrition who presented after a fall on her left hip  at home and sustained an intertrochanteric fracture of the left hip.  Assessment/Plan: Left hip fracture S/p mechanical fall. Stable on telemetry. S/p IM nailing of left hip early this am. toelrated sx well. She is confused post op. Pain control with when necessary Vicodin and IV morphine for severe pain. Supportive care with Tylenol and IV fluids. -Patient will need skilled nursing facility postop. [facility] -d/c foley in am  A. fib Rate controlled on monitor. Patient is on Cardizem at home. switched her to metoprolol 50 mg twice a day to provide perioperative beta blockade. Coumadin held for surgery and INR  Reversed with 2 FFP yesterday for sx. Resume coumadin. brisge with lovenox  Until INR >1.5   Anemia  likely from blood loss from surgery. doesnot need transfusion at this time. Monitor in am  Malnutrition  has PEG tube and takes both PEG feeds and by mouth. Will request nutrition consult. Resume diet. [facility] to inquire about the PEG tube she is on but want able to contact anyone. Have left a message for call back   Hypothyroidism  continue synthroid  History of CVA Stable.       Code Status: full code Family Communication: d/w sister Tamara QuinLinda on the phone on 6/20 Disposition Plan: needs SNF post op   Consultants:  Orthopedics: Dr. August Saucerean  Procedures:  IM nailing of left hip fracture on 6/21  Antibiotics:  None  HPI/Subjective: Patient seen this am. Had hip surgery overnight. Appears confused.  Objective: Filed Vitals:   08/14/13 0520  BP: 119/72  Pulse: 110  Temp: 98.4 F (36.9 C)  Resp: 20    Intake/Output Summary (Last 24  hours) at 08/14/13 0825 Last data filed at 08/14/13 [id number]  Gross per 24 hour  Intake 1767.25 ml  Output    835 ml  Net 932.25 ml   Filed Weights   08/13/13 0146  Weight: 42.7 kg (94 lb 2.2 oz)    Exam:   General: Elderly cachectic female in no acute distress  HEENT: No pallor, moist oral mucosa  Chest: Clear to auscultation bilaterally, no added sounds  CVS: S1 and S2 is regular, 2/6 systolic murmur,   Abdomen: Soft, nontender, nondistended, bowel sounds present, PEG in place, foley in place  Extremities: dressing over left hip intact  CNS: AAO x2, confused  *  Data Reviewed: Basic Metabolic Panel:  Recent Labs Lab 08/12/13 2315 08/14/13 0433  NA 141 139  K 3.6* 3.6*  CL 98 102  CO2 30 24  GLUCOSE 105* 115*  BUN 28* 21  CREATININE 0.53 0.45*  CALCIUM 9.7 8.5   Liver Function Tests: No results found for this basename: AST, ALT, ALKPHOS, BILITOT, PROT, ALBUMIN,  in the last 168 hours No results found for this basename: LIPASE, AMYLASE,  in the last 168 hours No results found for this basename: AMMONIA,  in the last 168 hours CBC:  Recent Labs Lab 08/12/13 2315 08/14/13 0433  WBC 4.2 10.0  NEUTROABS 3.0  --   HGB 11.8* 8.3*  HCT 36.2 25.2*  MCV 93.5 92.6  PLT 209 155   Cardiac Enzymes: No results found for this basename: CKTOTAL, CKMB, CKMBINDEX, TROPONINI,  in the last 168  hours BNP (last 3 results) No results found for this basename: PROBNP,  in the last 8760 hours CBG: No results found for this basename: GLUCAP,  in the last 168 hours  Recent Results (from the past 240 hour(s))  MRSA PCR SCREENING     Status: None   Collection Time    08/13/13  6:40 AM      Result Value Ref Range Status   MRSA by PCR NEGATIVE  NEGATIVE Final   Comment:            The GeneXpert MRSA Assay (FDA     approved for NASAL specimens     only), is one component of a     comprehensive MRSA colonization     surveillance program. It is not     intended to diagnose  MRSA     infection nor to guide or     monitor treatment for     MRSA infections.     Studies: Dg Chest 1 View  08/13/2013   CLINICAL DATA:  Status post fall.  Concern for chest injury.  EXAM: CHEST - 1 VIEW  COMPARISON:  None.  FINDINGS: The lungs are well-aerated. Likely chronic peribronchial thickening is noted. There is no evidence of focal opacification, pleural effusion or pneumothorax. Density at the medial right lung base is thought to reflect calcification of costochondral cartilage.  The cardiomediastinal silhouette is enlarged. No acute osseous abnormalities are seen. Scattered clips are seen overlying the right axilla.  IMPRESSION: 1. No displaced rib fracture seen. 2. Likely chronic peribronchial thickening noted; lungs otherwise clear. 3. Cardiomegaly noted.   Electronically Signed   By: Roanna RaiderJeffery  Chang M.D.   On: 08/13/2013 00:12   Dg Hip Complete Left  08/13/2013   CLINICAL DATA:  Status post fall.  Left hip pain.  EXAM: LEFT HIP - COMPLETE 2+ VIEW  COMPARISON:  None.  FINDINGS: There is a mildly comminuted intertrochanteric fracture through the proximal left femur, with a significantly displaced lesser trochanteric fragment. The left femoral head remains seated at the acetabulum. The right hip joint is unremarkable in appearance. No significant degenerative change is seen. Slight irregularity along the left inferior pubic ramus may reflect remote injury. The sacroiliac joints are unremarkable in appearance.  The visualized bowel gas pattern is grossly unremarkable.  IMPRESSION: Mildly comminuted intratrochanteric fracture through the proximal left femur, with a significantly displaced lesser trochanteric fragment.   Electronically Signed   By: Roanna RaiderJeffery  Chang M.D.   On: 08/13/2013 00:11   Dg Pelvis Portable  08/14/2013   CLINICAL DATA:  ORIF left femur fracture  EXAM: PORTABLE PELVIS 1-2 VIEWS  COMPARISON:  07/1913  FINDINGS: There is an intra medullary rod in the left femur addressing  and intertrochanteric femur fracture. There is near anatomic position and alignment of fracture fragments with the exception of displaced lesser trochanter, which is displaced medially as seen on the prior study. Orthopedic hardware appears in anticipated position with anticipated postoperative change laterally over the left gluteal region.  IMPRESSION: ORIF left intertrochanteric femur fracture   Electronically Signed   By: Esperanza Heiraymond  Rubner M.D.   On: 08/14/2013 07:22   Dg C-arm 1-60 Min-no Report  08/14/2013   CLINICAL DATA: Surgery for left hip fracture   C-ARM 1-60 MINUTES  Fluoroscopy was utilized by the requesting physician.  No radiographic  interpretation.     Scheduled Meds: . acetaminophen (TYLENOL) oral liquid 160 mg/5 mL  650 mg Per Tube Once  .  ceFAZolin (  ANCEF) IV  2 g Intravenous Q8H  . enoxaparin (LOVENOX) injection  30 mg Subcutaneous Q24H  . levothyroxine  50 mcg Per Tube QAC breakfast  . metoprolol tartrate  50 mg Oral BID  . warfarin  5 mg Oral Once  . Warfarin - Pharmacist Dosing Inpatient   Does not apply q1800   Continuous Infusions: . 0.9 % NaCl with KCl 20 mEq / L 75 mL/hr at 08/14/13 0150      Time spent: 25 minutes    Eddie North  [facility] Pager [phone] If 7PM-7AM, please contact night-coverage at [email or web address], password [facility] 08/14/2013, 8:25 AM  LOS: 2 days

## 2013-08-15 ENCOUNTER — Encounter (HOSPITAL_COMMUNITY): Payer: Self-pay | Admitting: Orthopedic Surgery

## 2013-08-15 DIAGNOSIS — I4891 Unspecified atrial fibrillation: Secondary | ICD-10-CM | POA: Diagnosis not present

## 2013-08-15 LAB — BASIC METABOLIC PANEL
BUN: 23 mg/dL (ref 6–23)
CALCIUM: 8.6 mg/dL (ref 8.4–10.5)
CO2: 27 mEq/L (ref 19–32)
CREATININE: 0.5 mg/dL (ref 0.50–1.10)
Chloride: 105 mEq/L (ref 96–112)
GFR, EST NON AFRICAN AMERICAN: 88 mL/min — AB (ref >90)
Glucose, Bld: 92 mg/dL (ref 70–99)
Potassium: 3.8 mEq/L (ref 3.7–5.3)
Sodium: 141 mEq/L (ref 137–147)

## 2013-08-15 LAB — CBC
HCT: 25.5 % — ABNORMAL LOW (ref 36.0–46.0)
Hemoglobin: 8.3 g/dL — ABNORMAL LOW (ref 12.0–15.0)
MCH: 30.7 pg (ref 26.0–34.0)
MCHC: 32.5 g/dL (ref 30.0–36.0)
MCV: 94.4 fL (ref 78.0–100.0)
PLATELETS: 153 10*3/uL (ref 150–400)
RBC: 2.7 MIL/uL — ABNORMAL LOW (ref 3.87–5.11)
RDW: 14.5 % (ref 11.5–15.5)
WBC: 8.9 10*3/uL (ref 4.0–10.5)

## 2013-08-15 LAB — VITAMIN D 25 HYDROXY (VIT D DEFICIENCY, FRACTURES): Vit D, 25-Hydroxy: 33 ng/mL (ref 30–89)

## 2013-08-15 LAB — PROTIME-INR
INR: 2.15 — ABNORMAL HIGH (ref 0.00–1.49)
Prothrombin Time: 23.3 seconds — ABNORMAL HIGH (ref 11.6–15.2)

## 2013-08-15 LAB — TSH: TSH: 1.63 u[IU]/mL (ref 0.350–4.500)

## 2013-08-15 MED ORDER — WARFARIN SODIUM 4 MG PO TABS
4.0000 mg | ORAL_TABLET | Freq: Once | ORAL | Status: AC
  Administered 2013-08-15: 4 mg via ORAL
  Filled 2013-08-15: qty 1

## 2013-08-15 MED ORDER — OSMOLITE 1.5 CAL PO LIQD
237.0000 mL | Freq: Every day | ORAL | Status: DC
  Administered 2013-08-15 – 2013-08-16 (×4): 237 mL
  Filled 2013-08-15 (×6): qty 237

## 2013-08-15 MED ORDER — DILTIAZEM HCL 25 MG/5ML IV SOLN
10.0000 mg | Freq: Once | INTRAVENOUS | Status: AC
  Administered 2013-08-15: 10 mg via INTRAVENOUS
  Filled 2013-08-15: qty 5

## 2013-08-15 MED ORDER — DILTIAZEM HCL 30 MG PO TABS
30.0000 mg | ORAL_TABLET | Freq: Four times a day (QID) | ORAL | Status: DC
  Administered 2013-08-15 – 2013-08-16 (×5): 30 mg via ORAL
  Filled 2013-08-15 (×8): qty 1

## 2013-08-15 NOTE — Progress Notes (Signed)
Pt HR nonstaining 135-145. Currently 115-137. Pt asymptomatic and lying in the bed. MD notified. New orders received. Will continue to monitor.

## 2013-08-15 NOTE — Anesthesia Postprocedure Evaluation (Signed)
  Anesthesia Post-op Note  Patient: Tamara LambertMary Ellen Bailey  Procedure(s) Performed: Procedure(s) (LRB): INTRAMEDULLARY (IM) Hip Screw FEMORAL (Left)  Patient Location: PACU  Anesthesia Type: General  Level of Consciousness: awake and alert   Airway and Oxygen Therapy: Patient Spontanous Breathing  Post-op Pain: mild  Post-op Assessment: Post-op Vital signs reviewed, Patient's Cardiovascular Status Stable, Respiratory Function Stable, Patent Airway and No signs of Nausea or vomiting  Last Vitals:  Filed Vitals:   08/15/13 1324  BP: 115/62  Pulse: 114  Temp: 37.8 C  Resp: 16    Post-op Vital Signs: stable   Complications: No apparent anesthesia complications

## 2013-08-15 NOTE — Progress Notes (Signed)
Physical Therapy Treatment Patient Details Name: Tamara PouchMary Ellen Bailey MRN: [id number] DOB: 10/31/1929 Today's Date: 08/15/2013    History of Present Illness 80 year old female with history of hypertension, atrial fib on Coumadin,CVA, severe malnutrition admitted 6/19 for fall on her left hip at home and sustained an intertrochanteric fracture of the left hip, s/p L IM nail.    PT Comments    Pt now allowed TDWB, able to pivot to recliner. Tolerated well. Noted HR in 120-130.  Follow Up Recommendations  SNF     Equipment Recommendations  None recommended by PT    Recommendations for Other Services       Precautions / Restrictions Precautions Precautions: Fall Restrictions Weight Bearing Restrictions: Yes LLE Weight Bearing: Touchdown weight bearing Other Position/Activity Restrictions: per  Dr. Diamantina Providenceean's note 6/21    Mobility  Bed Mobility Overal bed mobility: Needs Assistance Bed Mobility: Supine to Sit;Sit to Supine     Supine to sit: Min assist;HOB elevated     General bed mobility comments: verbal cues for technique, pt elevated HOB to get to sitting, pt able use rail  to the R to assist to sitting at EOB, min assist for LLE  Transfers Overall transfer level: Needs assistance Equipment used: Rolling walker (2 wheeled) Transfers: Stand Pivot Transfers Sit to Stand: Min assist Stand pivot transfers: Min assist       General transfer comment: bed elevated to assist to stand at RW. Pt practiced x 3 prior to pivot to recliner, frequent tactile cues and verbal cues for TDWB on LLE, pt able to grip RW with R hand with effort to ungrip hand.  Ambulation/Gait                 Stairs            Wheelchair Mobility    Modified Rankin (Stroke Patients Only)       Balance Overall balance assessment: Needs assistance;History of Falls Sitting-balance support: Feet supported;Bilateral upper extremity supported Sitting balance-Leahy Scale: Fair      Standing balance support: Bilateral upper extremity supported;During functional activity Standing balance-Leahy Scale: Poor                      Cognition Arousal/Alertness: Awake/alert Behavior During Therapy: WFL for tasks assessed/performed   Area of Impairment: Memory;Safety/judgement                    Exercises General Exercises - Lower Extremity Ankle Circles/Pumps: AROM;Both;10 reps;Supine Short Arc Quad: AROM;Both;10 reps;Supine Heel Slides: AAROM;Right;AROM;Left;10 reps;Supine Hip ABduction/ADduction: AAROM;Right;Left;10 reps;AROM;Supine    General Comments        Pertinent Vitals/Pain No c/o [pain, HR on bedside monitor 120-130. Sats not picking up.    Home Living                      Prior Function            PT Goals (current goals can now be found in the care plan section) Progress towards PT goals: Progressing toward goals    Frequency  Min 3X/week    PT Plan Current plan remains appropriate    Co-evaluation             End of Session Equipment Utilized During Treatment: Gait belt Activity Tolerance: Patient tolerated treatment well Patient left: in chair;with call bell/phone within reach     Time: 0926-1009 PT Time Calculation (min): 43 min  Charges:  $Therapeutic Exercise: 8-22 mins $  Therapeutic Activity: 23-37 mins                    G Codes:      Rada HayHill, Lache Dagher Elizabeth 08/15/2013, 10:40 AM Blanchard KelchKaren Izik Bingman PT [phone]

## 2013-08-15 NOTE — Progress Notes (Signed)
NUTRITION FOLLOW UP  Intervention:   -Recommend to modify Osmolite 1.5 @ 237 ml (one can) five times daily via PEG. -Tube feeding regimen will provided 1775 kcal (100% of needs), 75 grams of protein (100% est protein needs), and 905 ml of H2O. Will need 800 ml free water when IVF dc'd. -Recommend to modify pt to NPO, consider SLP consult to re-evaluate swallowing. -Will continue to monitor   Nutrition Dx:   Inadequate oral intake related to dysphagia as evidenced by documentation and verbalization-ongoing   Goal:   Pt to meet >/= 90% of their estimated nutrition needs    Monitor:   TF tolerance, diet order, swallow profile, labs, weight  Assessment:   [facility] Patient s/p MBS 02/2013 with recs for NPO. Patient refused. [facility] faxed the Refusal of Medical [facility] which stated that patient refused to follow recommended advice and was receiving a Pureed diet. TF orders were for 2 cal HN 1 can QID (1900 kcal, 80 gm protein, 644 ml free water daily). Refusal of Medical Treatment form and [facility] were place in paper chart here.  Spoke with patient's sister who reported patient was only eating about 1/10th of the pureed diet. Reported that prior to [facility] in [facility], patient would give part of her TF to her cat. Question how compliant patient was with TF prior to admit.  Patient underweight, thin and frail looking. Nutrition Focussed Physical Exam deferred at this time due to patient's confusion and aggravation with staff. Patient with decreased body fat and muscle mass.  Patient meets criteria for malnutrition related to chronic illness AEB depletion of body fat and muscle mass.  6/22: -Pt refused tube feeding last night (6/21) per RN note -Was able to tolerate 240 ml (one can) of Osmolite  1.5 at 1030AM per RN. No signs of aspiration or intolerance. Had not been advanced to 300 ml bolus d/t concern for nutrition support waste. Will modify regimen to meet >90% estimated nutrition needs with bolus volume that  pt can tolerate  -RN has been holding foods, medications are being administered by medication. Will continue to recommend modification to NPO status with updated SLP recommendations   Height: Ht Readings from Last 1 Encounters:  08/13/13 5' 2.5" (1.588 m)    Weight Status:   Wt Readings from Last 1 Encounters:  08/13/13 94 lb 2.2 oz (42.7 kg)    Re-estimated needs:  Kcal: 1700-1900 kcal  Protein: 65-75 gm protein  Fluid: >1.7L daily   Skin: WDL  Diet Order: General   Intake/Output Summary (Last 24 hours) at 08/15/13 1510 Last data filed at 08/15/13 1352  Gross per 24 hour  Intake    760 ml  Output    925 ml  Net   -165 ml    Last BM: pta   Labs:   Recent Labs Lab 08/12/13 2315 08/14/13 0433 08/15/13 0356  NA 141 139 141  K 3.6* 3.6* 3.8  CL 98 102 105  CO2 30 24 27   BUN 28* 21 23  CREATININE 0.53 0.45* 0.50  CALCIUM 9.7 8.5 8.6  GLUCOSE 105* 115* 92    CBG (last 3)  No results found for this basename: GLUCAP,  in the last 72 hours  Scheduled Meds: . acetaminophen (TYLENOL) oral liquid 160 mg/5 mL  650  mg Per Tube Once  . diltiazem  30 mg Oral 4 times per day  . feeding supplement (OSMOLITE 1.5 CAL)  120 mL Per Tube Once  . feeding supplement (OSMOLITE 1.5 CAL)  300 mL Per Tube QID  . levothyroxine  50 mcg Per Tube QAC breakfast  . warfarin  4 mg Oral ONCE-1800  . Warfarin - Pharmacist Dosing Inpatient   Does not apply q1800    Continuous Infusions:   Lloyd HugerSarah F Reida Hem MS RD LDN Clinical Dietitian Pager:[phone]

## 2013-08-15 NOTE — Progress Notes (Signed)
[facility] PROGRESS NOTE  Tamara Bailey NGE:[id number] DOB: 04/30/1929 DOA: 08/12/2013 PCP: Ginette OttoSTONEKING,HAL THOMAS, MD   Brief narrative 80 year old female with history of hypertension, A. fib on Coumadin, history of stroke, severe malnutrition who presented after a fall on her left hip  at home and sustained an intertrochanteric fracture of the left hip.  Assessment/Plan: Left hip fracture S/p mechanical fall. S/p IM nailing of left hip early this am. toelrated sx well. Confusion post op, now improving Pain control with when necessary Vicodin and IV morphine for severe pain. Supportive care with Tylenol . D/C IV fluids and foley -Patient will need skilled nursing facility postop. [facility] -PT following.  A. Fib with RVR  Patient is on Cardizem at home. switched her to metoprolol 50 mg twice a day to provide perioperative beta blockade. Coumadin held for surgery and INR  Reversed with 2 FFP . Now resumed and INR therapeutic. D/c sq lovenox. Will place her back on cardizem 30 mg q6hr ( home dose) given 10 mg IV push this am.    Anemia  likely from blood loss from surgery. doesnot need transfusion at this time. Monitor  Malnutrition  has PEG tube and takes both PEG feeds and by mouth. Tube feeds started by nutrition.   Hypothyroidism  continue synthroid  History of CVA Stable.       Code Status: full code Family Communication: d/w sister  on the phone on 6/20 Disposition Plan: needs SNF , possibly tomorrow   Consultants:  Orthopedics: Dr. August Saucerean  Procedures:  IM nailing of left hip fracture on 6/21  Antibiotics:  None  HPI/Subjective: Patient seen this am. tachycardic on monitor. Pain better on current meds  Objective: Filed Vitals:   08/15/13 1025  BP: 107/69  Pulse: 124  Temp:   Resp:     Intake/Output Summary (Last 24 hours) at 08/15/13 1128 Last data filed at 08/15/13 1026  Gross per 24 hour  Intake 1331.25 ml  Output     850 ml  Net 481.25 ml   Filed Weights   08/13/13 0146  Weight: 42.7 kg (94 lb 2.2 oz)    Exam:   General: Elderly cachectic female in no acute distress  HEENT: No pallor, moist oral mucosa  Chest: Clear to auscultation bilaterally, no added sounds  CVS: S1 and S2 irregular, 2/6 systolic murmur,   Abdomen: Soft, nontender, nondistended, bowel sounds present, PEG in place, foley in place  Extremities: dressing over left hip intact  CNS: AAO x2 non focal  *  Data Reviewed: Basic Metabolic Panel:  Recent Labs Lab 08/12/13 2315 08/14/13 0433 08/15/13 0356  NA 141 139 141  K 3.6* 3.6* 3.8  CL 98 102 105  CO2 30 24 27   GLUCOSE 105* 115* 92  BUN 28* 21 23  CREATININE 0.53 0.45* 0.50  CALCIUM 9.7 8.5 8.6   Liver Function Tests: No results found for this basename: AST, ALT, ALKPHOS, BILITOT, PROT, ALBUMIN,  in the last 168 hours No results found for this basename: LIPASE, AMYLASE,  in the last 168 hours No results found for this basename: AMMONIA,  in the last 168 hours CBC:  Recent Labs Lab 08/12/13 2315 08/14/13 0433 08/15/13 0356  WBC 4.2 10.0 8.9  NEUTROABS 3.0  --   --   HGB 11.8* 8.3* 8.3*  HCT 36.2 25.2* 25.5*  MCV 93.5 92.6 94.4  PLT 209 155 153   Cardiac Enzymes: No results found for this basename: CKTOTAL, CKMB, CKMBINDEX,  TROPONINI,  in the last 168 hours BNP (last 3 results) No results found for this basename: PROBNP,  in the last 8760 hours CBG: No results found for this basename: GLUCAP,  in the last 168 hours  Recent Results (from the past 240 hour(s))  MRSA PCR SCREENING     Status: None   Collection Time    08/13/13  6:40 AM      Result Value Ref Range Status   MRSA by PCR NEGATIVE  NEGATIVE Final   Comment:            The GeneXpert MRSA Assay (FDA     approved for NASAL specimens     only), is one component of a     comprehensive MRSA colonization     surveillance program. It is not     intended to diagnose MRSA     infection  nor to guide or     monitor treatment for     MRSA infections.     Studies: Dg Hip Operative Left  08/15/2013   CLINICAL DATA:  Left hip fracture.  EXAM: OPERATIVE LEFT HIP; DG C-ARM 1-60 MIN - NRPT [facility]  COMPARISON:  August 12, 2013.  FINDINGS: Three intraoperative fluoroscopic images of the left hip were submitted for review. These images demonstrate internal fixation of intertrochanteric fracture involving the proximal left femur. Good alignment of fracture components are noted.  IMPRESSION: Status post internal fixation of proximal left femoral intertrochanteric fracture.   Electronically Signed   By: Roque LiasJames  Green M.D.   On: 08/15/2013 07:51   Dg Pelvis Portable  08/14/2013   CLINICAL DATA:  ORIF left femur fracture  EXAM: PORTABLE PELVIS 1-2 VIEWS  COMPARISON:  07/1913  FINDINGS: There is an intra medullary rod in the left femur addressing and intertrochanteric femur fracture. There is near anatomic position and alignment of fracture fragments with the exception of displaced lesser trochanter, which is displaced medially as seen on the prior study. Orthopedic hardware appears in anticipated position with anticipated postoperative change laterally over the left gluteal region.  IMPRESSION: ORIF left intertrochanteric femur fracture   Electronically Signed   By: Esperanza Heiraymond  Rubner M.D.   On: 08/14/2013 07:22   Dg C-arm 1-60 Min-no Report  08/15/2013   CLINICAL DATA:  Left hip fracture.  EXAM: OPERATIVE LEFT HIP; DG C-ARM 1-60 MIN - NRPT [facility]  COMPARISON:  August 12, 2013.  FINDINGS: Three intraoperative fluoroscopic images of the left hip were submitted for review. These images demonstrate internal fixation of intertrochanteric fracture involving the proximal left femur. Good alignment of fracture components are noted.  IMPRESSION: Status post internal fixation of proximal left femoral intertrochanteric fracture.   Electronically Signed   By: Roque LiasJames  Green M.D.   On: 08/15/2013 07:51    Scheduled Meds: .  acetaminophen (TYLENOL) oral liquid 160 mg/5 mL  650 mg Per Tube Once  . diltiazem  30 mg Oral 4 times per day  . feeding supplement (OSMOLITE 1.5 CAL)  120 mL Per Tube Once  . feeding supplement (OSMOLITE 1.5 CAL)  300 mL Per Tube QID  . levothyroxine  50 mcg Per Tube QAC breakfast  . warfarin  4 mg Oral ONCE-1800  . Warfarin - Pharmacist Dosing Inpatient   Does not apply q1800   Continuous Infusions: . 0.9 % NaCl with KCl 20 mEq / L 75 mL/hr at 08/15/13 0424      Time spent: 25 minutes    DHUNGEL, NISHANT  [facility] Pager  [phone] If 7PM-7AM, please contact night-coverage at [email or web address], password [facility] 08/15/2013, 11:28 AM  LOS: 3 days

## 2013-08-15 NOTE — Care Management Note (Addendum)
    Page 1 of 1   08/16/2013     1:12:40 PM CARE MANAGEMENT NOTE 08/16/2013  Patient:  Tamara Bailey,Tamara Bailey   Account Number:  [id number]  Date Initiated:  08/15/2013  Documentation initiated by:  Lanier ClamMAHABIR,KATHY  Subjective/Objective Assessment:   80 Y/O F ADMITTED W/L HIP FX.PEG-TF.     Action/Plan:   FROM [facility]DEP [facility].   Anticipated DC Date:  08/16/2013   Anticipated DC Plan:  SKILLED NURSING FACILITY      DC Planning Services  CM consult      Choice offered to / List presented to:             Status of service:  Completed, signed off Medicare Important Message given?  YES (If response is "NO", the following Medicare IM given date fields will be blank) Date Medicare IM given:  08/16/2013 Date Additional Medicare IM given:    Discharge Disposition:  SKILLED NURSING FACILITY  Per UR Regulation:  Reviewed for med. necessity/level of care/duration of stay  If discussed at Long Length of Stay Meetings, dates discussed:    Comments:  08/16/13 KATHY MAHABIR RN,BSN NCM [phone] D/C [facility].RECEIVED CALL FROM [facility] REP MIKE-INFORMED HIM OF SNF SINCE THEY WILL FOLLOW FOR HHC IF NEEDED,THEY WERE FOLLOWING PTA.  08/15/13 KATHY MAHABIR RN,BSN NCM [phone] POD#1 IM NAIL L HIP.PT/OT-SNF.D/C PLAN SNF, LIKELY IN AM.

## 2013-08-15 NOTE — Progress Notes (Signed)
ANTICOAGULATION CONSULT NOTE - Follow-up Consult  Pharmacy Consult for Warfarin Indication: AFib  Allergies  Allergen Reactions  . Morphine And Related Nausea And Vomiting    Patient Measurements: Height: 5' 2.5" (158.8 cm) Weight: 94 lb 2.2 oz (42.7 kg) IBW/kg (Calculated) : 51.25  Vital Signs: Temp: 98.8 F (37.1 C) (06/22 0612) Temp src: Oral (06/22 0612) BP: 101/54 mmHg (06/22 0612) Pulse Rate: 111 (06/22 0612)  Labs:  Recent Labs  08/12/13 2315  08/13/13 1700 08/14/13 0433 08/14/13 0555 08/15/13 0356  HGB 11.8*  --   --  8.3*  --  8.3*  HCT 36.2  --   --  25.2*  --  25.5*  PLT 209  --   --  155  --  153  LABPROT 24.7*  < > 24.3*  --  17.3* 23.3*  INR 2.32*  < > 2.27*  --  1.45 2.15*  CREATININE 0.53  --   --  0.45*  --  0.50  < > = values in this interval not displayed.  Estimated Creatinine Clearance: 36.5 ml/min (by C-G formula based on Cr of 0.5).   Medical History: Past Medical History  Diagnosis Date  . Hypertension   . Stroke   . Atrial fibrillation     Medications:  Scheduled:  . acetaminophen (TYLENOL) oral liquid 160 mg/5 mL  650 mg Per Tube Once  . feeding supplement (OSMOLITE 1.5 CAL)  120 mL Per Tube Once  . feeding supplement (OSMOLITE 1.5 CAL)  300 mL Per Tube QID  . levothyroxine  50 mcg Per Tube QAC breakfast  . metoprolol tartrate  50 mg Oral BID  . Warfarin - Pharmacist Dosing Inpatient   Does not apply q1800   Infusions:  . 0.9 % NaCl with KCl 20 mEq / L 75 mL/hr at 08/15/13 0424    Assessment:  [age] yr female admitted yesterday with hip fracture s/p fall.  Pt now s/p repair of fracture  PTA pt on Warfarin 4mg  alternating with 5mg  every other day.  INR was therapeutic upon admission on this regimen.  INR = 2.15 today. Lovenox discontinued by MD today.  Goal of Therapy:  INR 2-3   Plan:   Warfarin 4mg  po x 1 today  F/U AM INR  Loma BostonLaura Moran, PharmD Pager: [phone] 08/15/2013 7:49 AM

## 2013-08-15 NOTE — Progress Notes (Signed)
Pt stable oob to chair today Ready for dc to snf Pain rx on chart tdwb lle

## 2013-08-16 DIAGNOSIS — D62 Acute posthemorrhagic anemia: Secondary | ICD-10-CM

## 2013-08-16 LAB — CBC
HEMATOCRIT: 25.1 % — AB (ref 36.0–46.0)
Hemoglobin: 8.2 g/dL — ABNORMAL LOW (ref 12.0–15.0)
MCH: 30.6 pg (ref 26.0–34.0)
MCHC: 32.7 g/dL (ref 30.0–36.0)
MCV: 93.7 fL (ref 78.0–100.0)
Platelets: 173 10*3/uL (ref 150–400)
RBC: 2.68 MIL/uL — ABNORMAL LOW (ref 3.87–5.11)
RDW: 14.6 % (ref 11.5–15.5)
WBC: 7.7 10*3/uL (ref 4.0–10.5)

## 2013-08-16 LAB — PROTIME-INR
INR: 2.16 — ABNORMAL HIGH (ref 0.00–1.49)
Prothrombin Time: 23.4 seconds — ABNORMAL HIGH (ref 11.6–15.2)

## 2013-08-16 MED ORDER — WARFARIN SODIUM 5 MG PO TABS
5.0000 mg | ORAL_TABLET | Freq: Once | ORAL | Status: DC
  Filled 2013-08-16: qty 1

## 2013-08-16 MED ORDER — MENTHOL 3 MG MT LOZG: 1.0000 | LOZENGE | OROMUCOSAL | Status: DC | PRN

## 2013-08-16 MED ORDER — OSMOLITE 1.5 CAL PO LIQD: 237.0000 mL | Freq: Every day | ORAL | Status: AC

## 2013-08-16 MED ORDER — DILTIAZEM HCL 30 MG PO TABS: 30.0000 mg | ORAL_TABLET | Freq: Four times a day (QID) | ORAL | Status: AC

## 2013-08-16 NOTE — Progress Notes (Signed)
ANTICOAGULATION CONSULT NOTE - Follow-up Consult  Pharmacy Consult for Warfarin Indication: AFib  Allergies  Allergen Reactions  . Morphine And Related Nausea And Vomiting    Patient Measurements: Height: 5' 2.5" (158.8 cm) Weight: 94 lb 2.2 oz (42.7 kg) IBW/kg (Calculated) : 51.25  Vital Signs: Temp: 99 F (37.2 C) (06/23 0722) Temp src: Oral (06/23 0528) BP: 112/62 mmHg (06/23 0528) Pulse Rate: 87 (06/23 0528)  Labs:  Recent Labs  08/14/13 0433 08/14/13 0555 08/15/13 0356 08/16/13 0407  HGB 8.3*  --  8.3* 8.2*  HCT 25.2*  --  25.5* 25.1*  PLT 155  --  153 173  LABPROT  --  17.3* 23.3* 23.4*  INR  --  1.45 2.15* 2.16*  CREATININE 0.45*  --  0.50  --     Estimated Creatinine Clearance: 36.5 ml/min (by C-G formula based on Cr of 0.5).   Medical History: Past Medical History  Diagnosis Date  . Hypertension   . Stroke   . Atrial fibrillation     Medications:  Scheduled:  . acetaminophen (TYLENOL) oral liquid 160 mg/5 mL  650 mg Per Tube Once  . diltiazem  30 mg Oral 4 times per day  . feeding supplement (OSMOLITE 1.5 CAL)  237 mL Per Tube 5 X Daily  . levothyroxine  50 mcg Per Tube QAC breakfast  . Warfarin - Pharmacist Dosing Inpatient   Does not apply q1800   Infusions:     Assessment:  [age] yr female h/o afib, CVA. Admitted yesterday with hip fracture s/p fall.  Pt now s/p repair of fracture  PTA pt on Warfarin 4mg  alternating with 5mg  every other day.  INR was therapeutic upon admission on this regimen.  INR = 2.16 today after warfarin 4mg  dose given yesterday.  Pt is NPO on tube feeding  Goal of Therapy:  INR 2-3   Plan:   Give Warfarin 5mg  po x 1 today  F/U AM INR  Loma BostonLaura Moran, PharmD Pager: [phone] 08/16/2013 7:49 AM

## 2013-08-16 NOTE — Discharge Summary (Addendum)
Physician Discharge Summary  Tamara Bailey UEA:[id number] DOB: 1929-11-10 DOA: 08/12/2013  PCP: Ginette Otto, MD  Admit date: 08/12/2013 Discharge date: 08/16/2013  Time spent: 40 minutes  Recommendations for Outpatient Follow-up:    Discharge Diagnoses:  Principle problem   Closed left hip fracture  Active Problems:   Severe protein-calorie malnutrition   Atrial fibrillation with RVR   Acute blood loss anemia   Discharge Condition: fair  Diet recommendation: regular and PEG feeds  Filed Weights   08/13/13 0146  Weight: 42.7 kg (94 lb 2.2 oz)    History of present illness:  Please refer to admission H&P for details, but in brief, 80 year old female with history of hypertension, A. fib on Coumadin, history of stroke, severe malnutrition who presented after a fall on her left hip at home and sustained an intertrochanteric fracture of the left hip.   Hospital Course:  Left hip fracture  S/p mechanical fall.  S/p IM nailing of left hip early this am. toelrated sx well. Confusion post op, now improving  Pain control with when necessary Vicodin .Marland Kitchen Supportive care with Tylenol . off IV fluids and foley  -Patient seen by PT and recommended skilled nursing facility for ongoing therapy . Ortho recommends touch down weight bearing on left leg. -coumadin resumed for DVT prophylaxis. INR therapeutic.  A. Fib with RVR  Patient is on Cardizem at home. switched her to metoprolol 50 mg twice a day to provide perioperative beta blockade.  Coumadin held for surgery and INR Reversed with 2 FFP . Now resumed and INR therapeutic.  Resumed cardizem and rate controlled on monitor.  Anemia  likely from blood loss from surgery. doesnot need transfusion at this time. Monitor as outpt  Malnutrition  has PEG tube and takes both PEG feeds and by mouth. Tube feeds started by nutrition. She is on Osmolite 300 ml4 times a day ( goal nutation of 1800 kcal  with  75 gm protein, and 1700 ml  free water.) Nutrition consult recommends patient o be kept NPO with swallow fn eval. consulted swallow evaluation prior to discharge.   Dysphagia Seen by bedside swallow eval. recommend dysphagia level 1   Hypothyroidism  continue synthroid   History of CVA  Stable.     Code Status: full code  Family Communication: d/w sister on the phone on 6/20  Disposition Plan: SNF  Consultants:  Orthopedics: Dr. August Saucer   Procedures:  IM nailing of left hip fracture on 6/21   Antibiotics:  None     Discharge Exam: Filed Vitals:   08/16/13 0722  BP:   Pulse:   Temp: 99 F (37.2 C)  Resp:     General: Elderly cachectic female in no acute distress  HEENT: No pallor, moist oral mucosa  Chest: Clear to auscultation bilaterally, no added sounds  CVS: S1 and S2 irregular, 2/6 systolic murmur,  Abdomen: Soft, nontender, nondistended, bowel sounds present, PEG in place,   Extremities: dressing over left hip intact  CNS: AAO x2 non focal    Discharge Instructions You were cared for by a hospitalist during your hospital stay. If you have any questions about your discharge medications or the care you received while you were in the hospital after you are discharged, you can call the unit and asked to speak with the hospitalist on call if the hospitalist that took care of you is not available. Once you are discharged, your primary care physician will handle any further medical issues. Please note that NO  REFILLS for any discharge medications will be authorized once you are discharged, as it is imperative that you return to your primary care physician (or establish a relationship with a primary care physician if you do not have one) for your aftercare needs so that they can reassess your need for medications and monitor your lab values.  Discharge Instructions   Touch down weight bearing    Complete by:  As directed             Medication List         diltiazem 30 MG tablet   Commonly known as:  CARDIZEM  Take 1 tablet (30 mg total) by mouth every 6 (six) hours.     feeding supplement (OSMOLITE 1.5 CAL) Liqd  Place 237 mLs into feeding tube 5 (five) times daily.     HYDROcodone-acetaminophen 5-325 MG per tablet  Commonly known as:  NORCO/VICODIN  Take 1-2 tablets by mouth every 6 (six) hours as needed for moderate pain.     levothyroxine 50 MCG tablet  Commonly known as:  SYNTHROID, LEVOTHROID  Take 50 mcg by mouth daily before breakfast.     menthol-cetylpyridinium 3 MG lozenge  Commonly known as:  CEPACOL  Take 1 lozenge (3 mg total) by mouth as needed for sore throat (sore throat).     triamterene-hydrochlorothiazide 37.5-25 MG per capsule  Commonly known as:  DYAZIDE  Take 1 capsule by mouth daily.     warfarin 5 MG tablet  Commonly known as:  COUMADIN  Take 5 mg by mouth daily. Alternating with 4mg  tablet     warfarin 4 MG tablet  Commonly known as:  COUMADIN  Take 4 mg by mouth daily. Alternating with 5mg  tablet       Allergies  Allergen Reactions  . Morphine And Related Nausea And Vomiting       Follow-up Information   Follow up with Ginette Otto, MD. (follow up at [facility])    Specialty:  Internal Medicine   Contact information:   [address] [place] [address] [phone]       Follow up with Cammy Copa, MD In 2 weeks. (Office will call )    Specialty:  Orthopedic Surgery   Contact information:   [address] [place] [address] [phone]        The results of significant diagnostics from this hospitalization (including imaging, microbiology, ancillary and laboratory) are listed below for reference.    Significant Diagnostic Studies: Dg Chest 1 View  08/13/2013   CLINICAL DATA:  Status post fall.  Concern for chest injury.  EXAM: CHEST - 1 VIEW  COMPARISON:  None.  FINDINGS: The lungs are well-aerated. Likely chronic peribronchial thickening is noted. There is no evidence  of focal opacification, pleural effusion or pneumothorax. Density at the medial right lung base is thought to reflect calcification of costochondral cartilage.  The cardiomediastinal silhouette is enlarged. No acute osseous abnormalities are seen. Scattered clips are seen overlying the right axilla.  IMPRESSION: 1. No displaced rib fracture seen. 2. Likely chronic peribronchial thickening noted; lungs otherwise clear. 3. Cardiomegaly noted.   Electronically Signed   By: Roanna Raider M.D.   On: 08/13/2013 00:12   Dg Hip Complete Left  08/13/2013   CLINICAL DATA:  Status post fall.  Left hip pain.  EXAM: LEFT HIP - COMPLETE 2+ VIEW  COMPARISON:  None.  FINDINGS: There is a mildly comminuted intertrochanteric fracture through the proximal left femur,  with a significantly displaced lesser trochanteric fragment. The left femoral head remains seated at the acetabulum. The right hip joint is unremarkable in appearance. No significant degenerative change is seen. Slight irregularity along the left inferior pubic ramus may reflect remote injury. The sacroiliac joints are unremarkable in appearance.  The visualized bowel gas pattern is grossly unremarkable.  IMPRESSION: Mildly comminuted intratrochanteric fracture through the proximal left femur, with a significantly displaced lesser trochanteric fragment.   Electronically Signed   By: Roanna RaiderJeffery  Chang M.D.   On: 08/13/2013 00:11   Dg Hip Operative Left  08/15/2013   CLINICAL DATA:  Left hip fracture.  EXAM: OPERATIVE LEFT HIP; DG C-ARM 1-60 MIN - NRPT [facility]  COMPARISON:  August 12, 2013.  FINDINGS: Three intraoperative fluoroscopic images of the left hip were submitted for review. These images demonstrate internal fixation of intertrochanteric fracture involving the proximal left femur. Good alignment of fracture components are noted.  IMPRESSION: Status post internal fixation of proximal left femoral intertrochanteric fracture.   Electronically Signed   By: Roque LiasJames  Green  M.D.   On: 08/15/2013 07:51   Dg Pelvis Portable  08/14/2013   CLINICAL DATA:  ORIF left femur fracture  EXAM: PORTABLE PELVIS 1-2 VIEWS  COMPARISON:  07/1913  FINDINGS: There is an intra medullary rod in the left femur addressing and intertrochanteric femur fracture. There is near anatomic position and alignment of fracture fragments with the exception of displaced lesser trochanter, which is displaced medially as seen on the prior study. Orthopedic hardware appears in anticipated position with anticipated postoperative change laterally over the left gluteal region.  IMPRESSION: ORIF left intertrochanteric femur fracture   Electronically Signed   By: Esperanza Heiraymond  Rubner M.D.   On: 08/14/2013 07:22   Dg C-arm 1-60 Min-no Report  08/15/2013   CLINICAL DATA:  Left hip fracture.  EXAM: OPERATIVE LEFT HIP; DG C-ARM 1-60 MIN - NRPT [facility]  COMPARISON:  August 12, 2013.  FINDINGS: Three intraoperative fluoroscopic images of the left hip were submitted for review. These images demonstrate internal fixation of intertrochanteric fracture involving the proximal left femur. Good alignment of fracture components are noted.  IMPRESSION: Status post internal fixation of proximal left femoral intertrochanteric fracture.   Electronically Signed   By: Roque LiasJames  Green M.D.   On: 08/15/2013 07:51    Microbiology: Recent Results (from the past 240 hour(s))  MRSA PCR SCREENING     Status: None   Collection Time    08/13/13  6:40 AM      Result Value Ref Range Status   MRSA by PCR NEGATIVE  NEGATIVE Final   Comment:            The GeneXpert MRSA Assay (FDA     approved for NASAL specimens     only), is one component of a     comprehensive MRSA colonization     surveillance program. It is not     intended to diagnose MRSA     infection nor to guide or     monitor treatment for     MRSA infections.     Labs: Basic Metabolic Panel:  Recent Labs Lab 08/12/13 2315 08/14/13 0433 08/15/13 0356  NA 141 139 141  K 3.6*  3.6* 3.8  CL 98 102 105  CO2 30 24 27   GLUCOSE 105* 115* 92  BUN 28* 21 23  CREATININE 0.53 0.45* 0.50  CALCIUM 9.7 8.5 8.6   Liver Function Tests: No results found for this basename: AST,  ALT, ALKPHOS, BILITOT, PROT, ALBUMIN,  in the last 168 hours No results found for this basename: LIPASE, AMYLASE,  in the last 168 hours No results found for this basename: AMMONIA,  in the last 168 hours CBC:  Recent Labs Lab 08/12/13 2315 08/14/13 0433 08/15/13 0356 08/16/13 0407  WBC 4.2 10.0 8.9 7.7  NEUTROABS 3.0  --   --   --   HGB 11.8* 8.3* 8.3* 8.2*  HCT 36.2 25.2* 25.5* 25.1*  MCV 93.5 92.6 94.4 93.7  PLT 209 155 153 173   Cardiac Enzymes: No results found for this basename: CKTOTAL, CKMB, CKMBINDEX, TROPONINI,  in the last 168 hours BNP: BNP (last 3 results) No results found for this basename: PROBNP,  in the last 8760 hours CBG: No results found for this basename: GLUCAP,  in the last 168 hours     Signed:  DHUNGEL, NISHANT  [facility] 08/16/2013, 10:05 AM

## 2013-08-16 NOTE — Evaluation (Signed)
Clinical/Bedside Swallow Evaluation Patient Details  Name: Tamara Bailey MRN: [id number] Date of Birth: 09/18/1929  Today's Date: 08/16/2013 Time: [id number] SLP Time Calculation (min): 22 min  Past Medical History:  Past Medical History  Diagnosis Date  . Hypertension   . Stroke   . Atrial fibrillation    Past Surgical History:  Past Surgical History  Procedure Laterality Date  . Femur im nail Left 08/13/2013    Procedure: INTRAMEDULLARY (IM) Hip Screw FEMORAL;  Surgeon: Tamara CopaGregory Scott Dean, MD;  Location: [facility];  Service: Orthopedics;  Laterality: Left;   HPI:  80 yo [facility] and having left hip fx, she is s/p surgery.  [facility] and had h/o severe dysphagia.  She had signed a release at SNF to continue po intake after MBS completed in January 2015 revealed severe dysphagia.  Pt does have a PEG tube that she uses for nutrition. Md ordered swallow evaluation.     Assessment / Plan / Recommendation Clinical Impression  Pt continues with symptoms of severe oropharyngeal dysphagia that appears consistent (if not exacerbated) compared to January 2015 MBS.  Pt continues with excessive secretions, delayed oral transiting, decreased laryngeal elevation/closure and overt coughing/throat clearing after each swallow.  Pt having to cough/hock after each swallow.  Pt educated to increased aspiration pna risk due to decreased mobility status and current level of dysphagia.    Pt continues to desire po with known risks.  Provided education in writing to pt to mitigate aspiration and advised her to rely on tube feeding for nutrition and small amount of po for pleasure.  MBS not warranted as it wil not change pt's outcomes.    SLP to sign off as all education completed.      Aspiration Risk  Severe    Diet Recommendation Thin liquid;Dysphagia 1 (Puree)   Liquid Administration via: Cup Medication Administration: Via alternative means Supervision:  Patient able to self feed Compensations: Slow rate;Small sips/bites (hock up and clear) Postural Changes and/or Swallow Maneuvers: Seated upright 90 degrees;Upright 30-60 min after meal    Other  Recommendations Oral Care Recommendations: Oral care before and after PO   Follow Up Recommendations    n/a   Frequency and Duration        Pertinent Vitals/Pain Afebrile,decrease      Swallow Study Prior Functional Status   see HHX     General Date of Onset: 08/16/13 HPI: 80 [facility] and having left hip fx, she is s/p surgery.  [facility] and had h/o severe dysphagia.  She had signed a release at SNF to continue po intake after MBS completed in January 2015 revealed severe dysphagia.  Pt does have a PEG tube that she uses for nutrition. Md ordered swallow evaluation.   Type of Study: Bedside swallow evaluation Diet Prior to this Study: Regular;Thin liquids Temperature Spikes Noted: No Respiratory Status: Room air History of Recent Intubation: No Behavior/Cognition: Alert;Cooperative;Pleasant mood Oral Cavity - Dentition: Missing dentition;Dentures, not available;Adequate natural dentition (lower denture and upper partial lost in Dec 2014 per pt) Self-Feeding Abilities: Able to feed self Patient Positioning: Upright in bed Baseline Vocal Quality: Low vocal intensity;Hoarse Volitional Cough: Weak Volitional Swallow: Able to elicit    Oral/Motor/Sensory Function Overall Oral Motor/Sensory Function: Impaired at baseline (h/o CVAs and left vocal fold paralysis, palatal elevation deviates left upon phonation) Labial ROM: Reduced left Labial Strength: Reduced Lingual ROM: Reduced left  Lingual Symmetry: Abnormal symmetry left Facial ROM: Reduced left Facial Symmetry: Left droop Facial Strength: Reduced Velum:  (deviates to left upon phonation)   Ice Chips Ice chips: Not tested   Thin Liquid Thin Liquid: Impaired Presentation: Cup;Self Fed Oral  Phase Impairments: Reduced lingual movement/coordination;Impaired anterior to posterior transit Oral Phase Functional Implications: Prolonged oral transit Pharyngeal  Phase Impairments: Suspected delayed Swallow;Decreased hyoid-laryngeal movement;Multiple swallows;Cough - Immediate;Throat Clearing - Delayed    Nectar Thick Nectar Thick Liquid: Not tested   Honey Thick Honey Thick Liquid: Not tested   Puree Puree: Impaired Presentation: Spoon;Self Fed Oral Phase Impairments: Reduced lingual movement/coordination;Impaired anterior to posterior transit Oral Phase Functional Implications: Prolonged oral transit;Oral residue Pharyngeal Phase Impairments: Suspected delayed Swallow;Multiple swallows;Cough - Immediate;Cough - Delayed   Solid   GO    Solid: Not tested Other Comments: pt is essentially edentulous and was on puree/thin at facility prior to admit       Mills KollerKimball, Tamara Bailey Tamara [name], MS [facility] SLP [phone]

## 2013-08-16 NOTE — Progress Notes (Signed)
Patient is set to discharge back to [facility] today. Patient & chaplain from [facility] at bedside aware, no family listed on facesheet. Discharge packet given to RN, Amil AmenJulia. [facility] called for transport.   Lincoln MaxinKelly Harrison, LCSW [facility] Clinical Social Worker cell #: [phone]

## 2013-11-11 ENCOUNTER — Emergency Department (HOSPITAL_COMMUNITY)
Admission: EM | Admit: 2013-11-11 | Discharge: 2013-11-11 | Disposition: A | Discharge status: Home / Self Care | Payer: Medicare Other | Attending: Emergency Medicine | Admitting: Emergency Medicine

## 2013-11-11 ENCOUNTER — Emergency Department (HOSPITAL_COMMUNITY): Payer: Medicare Other

## 2013-11-11 ENCOUNTER — Encounter (HOSPITAL_COMMUNITY): Payer: Self-pay | Admitting: Emergency Medicine

## 2013-11-11 DIAGNOSIS — Z7901 Long term (current) use of anticoagulants: Secondary | ICD-10-CM | POA: Insufficient documentation

## 2013-11-11 DIAGNOSIS — M25559 Pain in unspecified hip: Secondary | ICD-10-CM | POA: Insufficient documentation

## 2013-11-11 DIAGNOSIS — I4891 Unspecified atrial fibrillation: Secondary | ICD-10-CM | POA: Insufficient documentation

## 2013-11-11 DIAGNOSIS — R471 Dysarthria and anarthria: Secondary | ICD-10-CM | POA: Diagnosis not present

## 2013-11-11 DIAGNOSIS — R000 Tachycardia, unspecified: Secondary | ICD-10-CM | POA: Insufficient documentation

## 2013-11-11 DIAGNOSIS — Z8673 Personal history of transient ischemic attack (TIA), and cerebral infarction without residual deficits: Secondary | ICD-10-CM | POA: Insufficient documentation

## 2013-11-11 DIAGNOSIS — I10 Essential (primary) hypertension: Secondary | ICD-10-CM | POA: Diagnosis not present

## 2013-11-11 DIAGNOSIS — Z79899 Other long term (current) drug therapy: Secondary | ICD-10-CM | POA: Diagnosis not present

## 2013-11-11 DIAGNOSIS — M25552 Pain in left hip: Secondary | ICD-10-CM

## 2013-11-11 MED ORDER — TRAMADOL HCL 50 MG PO TABS
50.0000 mg | ORAL_TABLET | Freq: Once | ORAL | Status: AC
  Administered 2013-11-11: 50 mg
  Filled 2013-11-11: qty 1

## 2013-11-11 MED ORDER — DILTIAZEM HCL 30 MG PO TABS
30.0000 mg | ORAL_TABLET | Freq: Four times a day (QID) | ORAL | Status: DC
  Administered 2013-11-11: 30 mg via ORAL
  Filled 2013-11-11 (×5): qty 1

## 2013-11-11 MED ORDER — TRAMADOL HCL 50 MG PO TABS: 50.0000 mg | ORAL_TABLET | Freq: Four times a day (QID) | ORAL | Status: AC | PRN

## 2013-11-11 NOTE — ED Notes (Signed)
Pt reports left hip pain sts had a surgery on that hip in June and never got rid of pain since. Pt sts has norco at home for pain as needed, however it makes her nauseous so she prefers not to take it

## 2013-11-11 NOTE — Discharge Instructions (Signed)

## 2013-11-11 NOTE — ED Notes (Signed)
Pt denies fall or injury.  States pain has been in hip since surgery.

## 2013-11-11 NOTE — ED Notes (Addendum)
Per EMS pt coming from independent living part from [facility] with c/o 10/10 left hip pain. Pt sts she has hydrocodone for pain, but doesn't want to take it because "I odn't like the way it makes me feel". EMS reports pt was ambulatory to stretcher.

## 2013-11-11 NOTE — ED Provider Notes (Signed)
CSN: [id number]     Arrival date & time 11/11/13  4098 History   First MD Initiated Contact with Patient 11/11/13 [phone]     Chief Complaint  Patient presents with  . Hip Pain     (Consider location/radiation/quality/duration/timing/severity/associated sxs/prior Treatment) HPI  Tamara Bailey is a 80 y.o. female who is here for evaluation of left hip pain, which started spontaneously, 3 days ago. She tried taking her usual hydrocodone tablets, but they made her nauseated, so she did not take them. This morning, the pain was so bad that she did not take her usual morning medications. There are no other additional complaints. She denies vomiting, weakness, or dizziness. She is communicative, though slightly dysarthric, from her prior stroke. She has paralyzed vocal cords, so does not take anything, by mouth. There are no other known modifying factors   Past Medical History  Diagnosis Date  . Hypertension   . Stroke   . Atrial fibrillation    Past Surgical History  Procedure Laterality Date  . Femur im nail Left 08/13/2013    Procedure: INTRAMEDULLARY (IM) Hip Screw FEMORAL;  Surgeon: Cammy Copa, MD;  Location: [facility];  Service: Orthopedics;  Laterality: Left;   Family History  Problem Relation Age of Onset  . Diabetes     History  Substance Use Topics  . Smoking status: Never Smoker   . Smokeless tobacco: Not on file  . Alcohol Use: No   OB History   Grav Para Term Preterm Abortions TAB SAB Ect Mult Living                 Review of Systems  All other systems reviewed and are negative.     Allergies  Morphine and related  Home Medications   Prior to Admission medications   Medication Sig Start Date End Date Taking? Authorizing Provider  diltiazem (CARDIZEM) 30 MG tablet Take 1 tablet (30 mg total) by mouth every 6 (six) hours. 08/16/13  Yes Nishant Dhungel, MD  ferrous sulfate 220 (44 FE) MG/5ML solution Take by mouth daily. 6-20ml every day   Yes Historical  Provider, MD  levothyroxine (SYNTHROID, LEVOTHROID) 50 MCG tablet Take 50 mcg by mouth daily before breakfast.   Yes Historical Provider, MD  menthol-cetylpyridinium (CEPACOL) 3 MG lozenge Take 1 lozenge (3 mg total) by mouth as needed for sore throat (sore throat). 08/16/13  Yes Nishant Dhungel, MD  warfarin (COUMADIN) 4 MG tablet Take 4 mg by mouth daily. Alternating with  tablet   Yes Historical Provider, MD  Nutritional Supplements (FEEDING SUPPLEMENT, OSMOLITE 1.5 CAL,) LIQD Place 237 mLs into feeding tube 5 (five) times daily. 08/16/13   Nishant Dhungel, MD  traMADol (ULTRAM) 50 MG tablet Take 1 tablet (50 mg total) by mouth every 6 (six) hours as needed. 11/11/13   Flint Melter, MD  warfarin (COUMADIN) 5 MG tablet Take 5 mg by mouth daily. Alternating with  tablet    Historical Provider, MD   BP 104/58  Pulse 103  Temp(Src) 98.6 F (37 C) (Oral)  Resp 20  SpO2 96% Physical Exam  Nursing note and vitals reviewed. Constitutional: She is oriented to person, place, and time. She appears well-developed and well-nourished.  HENT:  Head: Normocephalic and atraumatic.  Eyes: Conjunctivae and EOM are normal. Pupils are equal, round, and reactive to light.  Neck: Normal range of motion and phonation normal. Neck supple.  Cardiovascular:  Tachycardia  Pulmonary/Chest: Effort normal and breath sounds normal. She exhibits  no tenderness.  Abdominal: Soft. She exhibits no distension. There is no tenderness. There is no guarding.  Musculoskeletal: Normal range of motion.  No focal left hip, tenderness, or altered motion. Examination, while supine.  Neurological: She is alert and oriented to person, place, and time. She exhibits normal muscle tone. Coordination normal.  Dysarthria, without any aphasia  Skin: Skin is warm and dry.  Psychiatric: She has a normal mood and affect. Her behavior is normal. Judgment and thought content normal.    ED Course  Procedures (including critical care  time) Medications  traMADol (ULTRAM) tablet 50 mg (50 mg Per Tube Given 11/11/13 1116)    No data found.   Evaluation discharge. She feels better after taking tramadol. She is able to without difficulty. There are no other complaints.    Labs Review Labs Reviewed - No data to display  Imaging Review No results found.   EKG Interpretation None      MDM   Final diagnoses:  Hip pain, left    Nonspecific hip pain, without evidence for fracture.   Nursing Notes Reviewed/ Care Coordinated Applicable Imaging Reviewed Interpretation of Laboratory Data incorporated into ED treatment  The patient appears reasonably screened and/or stabilized for discharge and I doubt any other medical condition or other [facility] requiring further screening, evaluation, or treatment in the ED at this time prior to discharge.  Plan: Home Medications- Tramadol, stop Norco; Home Treatments- rest; return here if the recommended treatment, does not improve the symptoms; Recommended follow up- PCP prn    Flint Melter, MD 11/14/13 1040

## 2013-11-14 ENCOUNTER — Ambulatory Visit (HOSPITAL_COMMUNITY)
Admission: RE | Admit: 2013-11-14 | Discharge: 2013-11-14 | Disposition: A | Discharge status: Home / Self Care | Payer: Medicare Other | Source: Ambulatory Visit | Attending: Geriatric Medicine | Admitting: Geriatric Medicine

## 2013-11-14 ENCOUNTER — Other Ambulatory Visit (HOSPITAL_COMMUNITY): Payer: Self-pay | Admitting: Geriatric Medicine

## 2013-11-14 DIAGNOSIS — Z431 Encounter for attention to gastrostomy: Secondary | ICD-10-CM | POA: Diagnosis not present

## 2013-11-14 DIAGNOSIS — R633 Feeding difficulties, unspecified: Secondary | ICD-10-CM

## 2013-11-14 MED ORDER — IOHEXOL 300 MG/ML  SOLN
50.0000 mL | Freq: Once | INTRAMUSCULAR | Status: AC | PRN
  Administered 2013-11-14: 10 mL

## 2014-02-23 ENCOUNTER — Encounter (HOSPITAL_COMMUNITY): Payer: Self-pay | Admitting: Emergency Medicine

## 2014-02-23 ENCOUNTER — Emergency Department (HOSPITAL_COMMUNITY)
Admission: EM | Admit: 2014-02-23 | Discharge: 2014-02-23 | Disposition: A | Discharge status: Home / Self Care | Payer: Medicare Other | Attending: Emergency Medicine | Admitting: Emergency Medicine

## 2014-02-23 DIAGNOSIS — I4891 Unspecified atrial fibrillation: Secondary | ICD-10-CM | POA: Insufficient documentation

## 2014-02-23 DIAGNOSIS — I499 Cardiac arrhythmia, unspecified: Secondary | ICD-10-CM | POA: Insufficient documentation

## 2014-02-23 DIAGNOSIS — I10 Essential (primary) hypertension: Secondary | ICD-10-CM | POA: Diagnosis not present

## 2014-02-23 DIAGNOSIS — Z7901 Long term (current) use of anticoagulants: Secondary | ICD-10-CM | POA: Diagnosis not present

## 2014-02-23 DIAGNOSIS — Z8673 Personal history of transient ischemic attack (TIA), and cerebral infarction without residual deficits: Secondary | ICD-10-CM | POA: Insufficient documentation

## 2014-02-23 DIAGNOSIS — Z79899 Other long term (current) drug therapy: Secondary | ICD-10-CM | POA: Diagnosis not present

## 2014-02-23 DIAGNOSIS — K9423 Gastrostomy malfunction: Secondary | ICD-10-CM | POA: Diagnosis not present

## 2014-02-23 NOTE — Discharge Instructions (Signed)
We were unable to replace your PEG tube today.  Please contact the radiology office tomorrow to schedule appointment.    Gastrostomy Tube Home Guide A gastrostomy tube is a tube that is surgically placed into the stomach. It is also called a "G-tube." G-tubes are used when a person is unable to eat and drink enough on their own to stay healthy. The tube is inserted into the stomach through a small cut (incision) in the skin. This tube is used for:  Feeding.  Giving medication. GASTROSTOMY TUBE CARE  Wash your hands with soap and water.  Remove the old dressing (if any). Some styles of G-tubes may need a dressing inserted between the skin and the G-tube. Other types of G-tubes do not require a dressing. Ask your health care provider if a dressing is needed.  Check the area where the tube enters the skin (insertion site) for redness, swelling, or pus-like (purulent) drainage. A small amount of clear or tan liquid drainage is normal. Check to make sure scar tissue (skin) is not growing around the insertion site. This could have a raised, bumpy appearance.  A cotton swab can be used to clean the skin around the tube:  When the G-tube is first put in, a normal saline solution or water can be used to clean the skin.  Mild soap and warm water can be used when the skin around the G-tube site has healed.  Roll the cotton swab around the G-tube insertion site to remove any drainage or crusting at the insertion site. STOMACH RESIDUALS Feeding tube residuals are the amount of liquids that are in the stomach at any given time. Residuals may be checked before giving feedings, medications, or as instructed by your health care provider.  Ask your health care provider if there are instances when you would not start tube feedings depending on the amount or type of contents withdrawn from the stomach.  Check residuals by attaching a syringe to the G-tube and pulling back on the syringe plunger. Note the  amount, and return the residual back into the stomach. FLUSHING THE G-TUBE  The G-tube should be periodically flushed with clean warm water to keep it from clogging.  Flush the G-tube after feedings or medications. Draw up 30 mL of warm water in a syringe. Connect the syringe to the G-tube and slowly push the water into the tube.  Do not push feedings, medications, or flushes rapidly. Flush the G-tube gently and slowly.  Only use syringes made for G-tubes to flush medications or feedings.  Your health care provider may want the G-tube flushed more often or with more water. If this is the case, follow your health care provider's instructions. FEEDINGS Your health care provider will determine whether feedings are given as a bolus (a certain amount given at one time and at scheduled times) or whether feedings will be given continuously on a feeding pump.   Formulas should be given at room temperature.  If feedings are continuous, no more than 4 hours worth of feedings should be placed in the feeding bag. This helps prevent spoilage or accidental excess infusion.  Cover and place unused formula in the refrigerator.  If feedings are continuous, stop the feedings when medications or flushes are given. Be sure to restart the feedings.  Feeding bags and syringes should be replaced as instructed by your health care provider. GIVING MEDICATION   In general, it is best if all medications are in a liquid form for G-tube administration.  Liquid medications are less likely to clog the G-tube.  Mix the liquid medication with 30 mL (or amount recommended by your health care provider) of warm water.  Draw up the medication into the syringe.  Attach the syringe to the G-tube and slowly push the mixture into the G-tube.  After giving the medication, draw up 30 mL of warm water in the syringe and slowly flush the G-tube.  For pills or capsules, check with your health care provider first before  crushing medications. Some pills are not effective if they are crushed. Some capsules are sustained-release medications.  If appropriate, crush the pill or capsule and mix with 30 mL of warm water. Using the syringe, slowly push the medication through the tube, then flush the tube with another 30 mL of tap water. G-TUBE PROBLEMS G-tube was pulled out.  Cause: May have been pulled out accidentally.  Solutions: Cover the opening with clean dressing and tape. Call your health care provider right away. The G-tube should be put in as soon as possible (within 4 hours) so the G-tube opening (tract) does not close. The G-tube needs to be put in at a health care setting. An X-ray needs to be done to confirm placement before the G-tube can be used again. Redness, irritation, soreness, or foul odor around the gastrostomy site.  Cause: May be caused by leakage or infection.  Solutions: Call your health care provider right away. Large amount of leakage of fluid or mucus-like liquid present (a large amount means it soaks clothing).  Cause: Many reasons could cause the G-tube to leak.  Solutions: Call your health care provider to discuss the amount of leakage. Skin or scar tissue appears to be growing where tube enters skin.   Cause: Tissue growth may develop around the insertion site if the G-tube is moved or pulled on excessively.  Solutions: Secure tube with tape so that excess movement does not occur. Call your health care provider. G-tube is clogged.  Cause: Thick formula or medication.  Solutions: Try to slowly push warm water into the tube with a large syringe. Never try to push any object into the tube to unclog it. Do not force fluid into the G-tube. If you are unable to unclog the tube, call your health care provider right away. TIPS  Head of bed (HOB) position refers to the upright position of a person's upper body.  When giving medications or a feeding bolus, keep the [facility] up as told by  your health care provider. Do this during the feeding and for 1 hour after the feeding or medication administration.  If continuous feedings are being given, it is best to keep the [facility] up as told by your health care provider. When ADLs (activities of daily living) are performed and the [facility] needs to be flat, be sure to turn the feeding pump off. Restart the feeding pump when the [facility] is returned to the recommended height.  Do not pull or put tension on the tube.  To prevent fluid backflow, kink the G-tube before removing the cap or disconnecting a syringe.  Check the G-tube length every day. Measure from the insertion site to the end of the G-tube. If the length is longer than previous measurements, the tube may be coming out. Call your health care provider if you notice increasing G-tube length.  Oral care, such as brushing teeth, must be continued.  You may need to remove excess air (vent) from the G-tube. Your health care provider will  tell you if this is needed.  Always call your health care provider if you have questions or problems with the G-tube. SEEK IMMEDIATE MEDICAL CARE IF:   You have severe abdominal pain, tenderness, or abdominal bloating (distension).  You have nausea or vomiting.  You are constipated or have problems moving your bowels.  The G-tube insertion site is red, swollen, has a foul smell, or has yellow or brown drainage.  You have difficulty breathing or shortness of breath.  You have a fever.  You have a large amount of feeding tube residuals.  The G-tube is clogged and cannot be flushed. MAKE SURE YOU:   Understand these instructions.  Will watch your condition.  Will get help right away if you are not doing well or get worse. Document Released: 04/21/2001 Document Revised: 06/27/2013 Document Reviewed: 10/18/2012 [facility] Patient Information 2015 [place], [place]. This information is not intended to replace advice given to you by your health care  provider. Make sure you discuss any questions you have with your health care provider.

## 2014-02-23 NOTE — ED Notes (Signed)
Pt brought to ED from Nursing Home ref.  Holes in peg tube.  Pt st's first noticed yesterday.  St's when she gets feedings it comes out the holes

## 2014-02-23 NOTE — ED Provider Notes (Signed)
CSN: [id number]     Arrival date & time 02/23/14  1514 History   First MD Initiated Contact with Patient 02/23/14 1654     No chief complaint on file.    The history is provided by the patient.   Ms. Tamara Bailey presents for evaluation of a hole in her PEG tube. She reports that her PEG tube began leaking last night with feeds. She is still able to get her to peds through the tube, but there is leaking through the sides. She denies any abdominal pain, fevers, difficulty breathing, chest pain, diarrhea. She states that she is at her routine set of health. She's had no injuries or trauma to the PEG tube that she can think of. The tube has been in place for years. Symptoms are mild, constant, worsening.  Past Medical History  Diagnosis Date  . Hypertension   . Stroke   . Atrial fibrillation    Past Surgical History  Procedure Laterality Date  . Femur im nail Left 08/13/2013    Procedure: INTRAMEDULLARY (IM) Hip Screw FEMORAL;  Surgeon: Cammy CopaGregory Scott Dean, MD;  Location: [facility];  Service: Orthopedics;  Laterality: Left;   Family History  Problem Relation Age of Onset  . Diabetes     History  Substance Use Topics  . Smoking status: Never Smoker   . Smokeless tobacco: Not on file  . Alcohol Use: No   OB History    No data available     Review of Systems  All other systems reviewed and are negative.     Allergies  Morphine and related  Home Medications   Prior to Admission medications   Medication Sig Start Date End Date Taking? Authorizing Provider  diltiazem (CARDIZEM) 30 MG tablet Take 1 tablet (30 mg total) by mouth every 6 (six) hours. 08/16/13   Nishant Dhungel, MD  ferrous sulfate 220 (44 FE) MG/5ML solution Take by mouth daily. 6-618ml every day    Historical Provider, MD  levothyroxine (SYNTHROID, LEVOTHROID) 50 MCG tablet Take 50 mcg by mouth daily before breakfast.    Historical Provider, MD  menthol-cetylpyridinium (CEPACOL) 3 MG lozenge Take 1 lozenge (3 mg total) by  mouth as needed for sore throat (sore throat). 08/16/13   Nishant Dhungel, MD  Nutritional Supplements (FEEDING SUPPLEMENT, OSMOLITE 1.5 CAL,) LIQD Place 237 mLs into feeding tube 5 (five) times daily. 08/16/13   Nishant Dhungel, MD  traMADol (ULTRAM) 50 MG tablet Take 1 tablet (50 mg total) by mouth every 6 (six) hours as needed. 11/11/13   Flint MelterElliott L Wentz, MD  warfarin (COUMADIN) 4 MG tablet Take 4 mg by mouth daily. Alternating with 5mg  tablet    Historical Provider, MD  warfarin (COUMADIN) 5 MG tablet Take 5 mg by mouth daily. Alternating with 4mg  tablet    Historical Provider, MD   BP 104/86 mmHg  Pulse 106  Temp(Src) 99.5 F (37.5 C) (Oral)  Resp 12  Ht 5' 2.5" (1.588 m)  Wt 107 lb (48.535 kg)  BMI 19.25 kg/m2  SpO2 96% Physical Exam  Constitutional: She is oriented to person, place, and time. She appears well-developed and well-nourished.  HENT:  Head: Normocephalic and atraumatic.  Cardiovascular: Normal rate.   No murmur heard. Irregular rhythm  Pulmonary/Chest: Effort normal and breath sounds normal. No respiratory distress.  Abdominal: Soft. There is no tenderness. There is no rebound and no guarding.  PEG tube in place in the upper abdomen without surrounding erythema or edema. 18 French tube is in  place. There are small holes along the PEG tube catheter that are leaking fluid.  Musculoskeletal: She exhibits no tenderness.  2+ pitting edema in bilateral lower extremities  Neurological: She is alert and oriented to person, place, and time.  Skin: Skin is warm and dry.  Psychiatric: She has a normal mood and affect. Her behavior is normal.  Nursing note and vitals reviewed.   ED Course  Procedures (including critical care time) Labs Review Labs Reviewed - No data to display  Imaging Review No results found.   EKG Interpretation None      MDM   Final diagnoses:  PEG tube malfunction    Patient here for evaluation of leaking PEG tube. Discussed with  interventional radiologist on-call, unable to get PEG tube replaced this time. Patient had prior replacements 3 interventional radiology, patient is not sure why. Plan to have patient DC back to facility with planned outpatient PEG tube replacement through interventional radiology. I will call the patient with an appointment to get the tube replaced this weekend.    Tilden FossaElizabeth Piercen Covino, MD 02/23/14 [phone]

## 2014-02-23 NOTE — ED Notes (Signed)
This RN spoke to the Environmental education officerN Supervisor at [facility]. Informed of potential call back on Saturday for IR to repair peg tube. RN Supervisor will check in with pt Saturday to confirm appointment.

## 2014-02-23 NOTE — ED Notes (Signed)
[facility] and [facility] to [facility]

## 2014-02-27 ENCOUNTER — Other Ambulatory Visit (HOSPITAL_COMMUNITY): Payer: Self-pay | Admitting: Interventional Radiology

## 2014-02-27 ENCOUNTER — Ambulatory Visit (HOSPITAL_COMMUNITY)
Admission: RE | Admit: 2014-02-27 | Discharge: 2014-02-27 | Disposition: A | Discharge status: Home / Self Care | Payer: Medicare Other | Source: Ambulatory Visit | Attending: Interventional Radiology | Admitting: Interventional Radiology

## 2014-02-27 DIAGNOSIS — T85528A Displacement of other gastrointestinal prosthetic devices, implants and grafts, initial encounter: Secondary | ICD-10-CM | POA: Insufficient documentation

## 2014-02-27 DIAGNOSIS — Y833 Surgical operation with formation of external stoma as the cause of abnormal reaction of the patient, or of later complication, without mention of misadventure at the time of the procedure: Secondary | ICD-10-CM | POA: Diagnosis not present

## 2014-02-27 DIAGNOSIS — R1310 Dysphagia, unspecified: Secondary | ICD-10-CM

## 2014-02-27 DIAGNOSIS — Z931 Gastrostomy status: Secondary | ICD-10-CM | POA: Insufficient documentation

## 2014-02-27 MED ORDER — IOHEXOL 300 MG/ML  SOLN
50.0000 mL | Freq: Once | INTRAMUSCULAR | Status: AC | PRN
  Administered 2014-02-27: 10 mL

## 2014-02-27 MED ORDER — LIDOCAINE VISCOUS 2 % MT SOLN
OROMUCOSAL | Status: AC
  Filled 2014-02-27: qty 15

## 2014-06-22 ENCOUNTER — Other Ambulatory Visit: Payer: Self-pay | Admitting: Geriatric Medicine

## 2014-06-22 DIAGNOSIS — Z1231 Encounter for screening mammogram for malignant neoplasm of breast: Secondary | ICD-10-CM

## 2014-06-22 DIAGNOSIS — Z853 Personal history of malignant neoplasm of breast: Secondary | ICD-10-CM

## 2014-07-05 ENCOUNTER — Ambulatory Visit
Admission: RE | Admit: 2014-07-05 | Discharge: 2014-07-05 | Disposition: A | Discharge status: Home / Self Care | Payer: Medicare Other | Source: Ambulatory Visit | Attending: Geriatric Medicine | Admitting: Geriatric Medicine

## 2014-07-05 DIAGNOSIS — Z853 Personal history of malignant neoplasm of breast: Secondary | ICD-10-CM

## 2014-07-05 DIAGNOSIS — Z1231 Encounter for screening mammogram for malignant neoplasm of breast: Secondary | ICD-10-CM

## 2014-08-22 ENCOUNTER — Encounter (HOSPITAL_COMMUNITY): Payer: Self-pay | Admitting: Emergency Medicine

## 2014-08-22 ENCOUNTER — Inpatient Hospital Stay (HOSPITAL_COMMUNITY)
Admission: EM | Admit: 2014-08-22 | Discharge: 2014-09-25 | DRG: 871 | Disposition: E | Payer: Medicare Other | Attending: Internal Medicine | Admitting: Internal Medicine

## 2014-08-22 ENCOUNTER — Emergency Department (HOSPITAL_COMMUNITY): Payer: Medicare Other

## 2014-08-22 DIAGNOSIS — S72009A Fracture of unspecified part of neck of unspecified femur, initial encounter for closed fracture: Secondary | ICD-10-CM | POA: Diagnosis present

## 2014-08-22 DIAGNOSIS — E039 Hypothyroidism, unspecified: Secondary | ICD-10-CM | POA: Diagnosis present

## 2014-08-22 DIAGNOSIS — F0390 Unspecified dementia without behavioral disturbance: Secondary | ICD-10-CM | POA: Diagnosis present

## 2014-08-22 DIAGNOSIS — Z853 Personal history of malignant neoplasm of breast: Secondary | ICD-10-CM | POA: Diagnosis not present

## 2014-08-22 DIAGNOSIS — I509 Heart failure, unspecified: Secondary | ICD-10-CM

## 2014-08-22 DIAGNOSIS — E46 Unspecified protein-calorie malnutrition: Secondary | ICD-10-CM | POA: Diagnosis present

## 2014-08-22 DIAGNOSIS — Z79899 Other long term (current) drug therapy: Secondary | ICD-10-CM | POA: Diagnosis not present

## 2014-08-22 DIAGNOSIS — Z8673 Personal history of transient ischemic attack (TIA), and cerebral infarction without residual deficits: Secondary | ICD-10-CM

## 2014-08-22 DIAGNOSIS — I5023 Acute on chronic systolic (congestive) heart failure: Secondary | ICD-10-CM | POA: Diagnosis present

## 2014-08-22 DIAGNOSIS — I4510 Unspecified right bundle-branch block: Secondary | ICD-10-CM | POA: Diagnosis present

## 2014-08-22 DIAGNOSIS — Z7901 Long term (current) use of anticoagulants: Secondary | ICD-10-CM | POA: Insufficient documentation

## 2014-08-22 DIAGNOSIS — I429 Cardiomyopathy, unspecified: Secondary | ICD-10-CM | POA: Diagnosis present

## 2014-08-22 DIAGNOSIS — R64 Cachexia: Secondary | ICD-10-CM | POA: Diagnosis present

## 2014-08-22 DIAGNOSIS — I4891 Unspecified atrial fibrillation: Secondary | ICD-10-CM | POA: Diagnosis not present

## 2014-08-22 DIAGNOSIS — Z931 Gastrostomy status: Secondary | ICD-10-CM

## 2014-08-22 DIAGNOSIS — Z515 Encounter for palliative care: Secondary | ICD-10-CM | POA: Diagnosis not present

## 2014-08-22 DIAGNOSIS — Z681 Body mass index (BMI) 19 or less, adult: Secondary | ICD-10-CM | POA: Diagnosis not present

## 2014-08-22 DIAGNOSIS — Z66 Do not resuscitate: Secondary | ICD-10-CM | POA: Diagnosis present

## 2014-08-22 DIAGNOSIS — J9601 Acute respiratory failure with hypoxia: Secondary | ICD-10-CM | POA: Diagnosis present

## 2014-08-22 DIAGNOSIS — Y95 Nosocomial condition: Secondary | ICD-10-CM | POA: Diagnosis present

## 2014-08-22 DIAGNOSIS — A419 Sepsis, unspecified organism: Principal | ICD-10-CM | POA: Diagnosis present

## 2014-08-22 DIAGNOSIS — Z9221 Personal history of antineoplastic chemotherapy: Secondary | ICD-10-CM

## 2014-08-22 DIAGNOSIS — R0600 Dyspnea, unspecified: Secondary | ICD-10-CM | POA: Diagnosis present

## 2014-08-22 DIAGNOSIS — R0602 Shortness of breath: Secondary | ICD-10-CM

## 2014-08-22 DIAGNOSIS — E872 Acidosis: Secondary | ICD-10-CM | POA: Diagnosis present

## 2014-08-22 DIAGNOSIS — I10 Essential (primary) hypertension: Secondary | ICD-10-CM | POA: Diagnosis present

## 2014-08-22 DIAGNOSIS — J9600 Acute respiratory failure, unspecified whether with hypoxia or hypercapnia: Secondary | ICD-10-CM | POA: Diagnosis present

## 2014-08-22 DIAGNOSIS — R1310 Dysphagia, unspecified: Secondary | ICD-10-CM | POA: Diagnosis present

## 2014-08-22 DIAGNOSIS — J189 Pneumonia, unspecified organism: Secondary | ICD-10-CM | POA: Diagnosis present

## 2014-08-22 DIAGNOSIS — R627 Adult failure to thrive: Secondary | ICD-10-CM | POA: Diagnosis present

## 2014-08-22 DIAGNOSIS — R29810 Facial weakness: Secondary | ICD-10-CM | POA: Diagnosis present

## 2014-08-22 DIAGNOSIS — E876 Hypokalemia: Secondary | ICD-10-CM | POA: Diagnosis not present

## 2014-08-22 DIAGNOSIS — G9349 Other encephalopathy: Secondary | ICD-10-CM | POA: Diagnosis not present

## 2014-08-22 DIAGNOSIS — R7989 Other specified abnormal findings of blood chemistry: Secondary | ICD-10-CM

## 2014-08-22 DIAGNOSIS — I5021 Acute systolic (congestive) heart failure: Secondary | ICD-10-CM | POA: Diagnosis not present

## 2014-08-22 DIAGNOSIS — J690 Pneumonitis due to inhalation of food and vomit: Secondary | ICD-10-CM | POA: Diagnosis not present

## 2014-08-22 DIAGNOSIS — I482 Chronic atrial fibrillation, unspecified: Secondary | ICD-10-CM | POA: Diagnosis present

## 2014-08-22 DIAGNOSIS — M25561 Pain in right knee: Secondary | ICD-10-CM | POA: Diagnosis present

## 2014-08-22 DIAGNOSIS — E038 Other specified hypothyroidism: Secondary | ICD-10-CM | POA: Diagnosis present

## 2014-08-22 DIAGNOSIS — J3800 Paralysis of vocal cords and larynx, unspecified: Secondary | ICD-10-CM | POA: Diagnosis present

## 2014-08-22 DIAGNOSIS — Z7189 Other specified counseling: Secondary | ICD-10-CM | POA: Diagnosis not present

## 2014-08-22 HISTORY — DX: Unspecified protein-calorie malnutrition: E46

## 2014-08-22 HISTORY — DX: Do not resuscitate: Z66

## 2014-08-22 LAB — BASIC METABOLIC PANEL
Anion gap: 14 (ref 5–15)
BUN: 35 mg/dL — AB (ref 6–20)
CO2: 25 mmol/L (ref 22–32)
CREATININE: 0.65 mg/dL (ref 0.44–1.00)
Calcium: 9.6 mg/dL (ref 8.9–10.3)
Chloride: 93 mmol/L — ABNORMAL LOW (ref 101–111)
GLUCOSE: 135 mg/dL — AB (ref 65–99)
Potassium: 4.4 mmol/L (ref 3.5–5.1)
Sodium: 132 mmol/L — ABNORMAL LOW (ref 135–145)

## 2014-08-22 LAB — CBC
HCT: 39.5 % (ref 36.0–46.0)
HEMOGLOBIN: 12.6 g/dL (ref 12.0–15.0)
MCH: 29 pg (ref 26.0–34.0)
MCHC: 31.9 g/dL (ref 30.0–36.0)
MCV: 91 fL (ref 78.0–100.0)
Platelets: 206 10*3/uL (ref 150–400)
RBC: 4.34 MIL/uL (ref 3.87–5.11)
RDW: 16 % — ABNORMAL HIGH (ref 11.5–15.5)
WBC: 6 10*3/uL (ref 4.0–10.5)

## 2014-08-22 LAB — BRAIN NATRIURETIC PEPTIDE: B Natriuretic Peptide: 1058.7 pg/mL — ABNORMAL HIGH (ref 0.0–100.0)

## 2014-08-22 LAB — LACTIC ACID, PLASMA
Lactic Acid, Venous: 2 mmol/L (ref 0.5–2.0)
Lactic Acid, Venous: 2.7 mmol/L (ref 0.5–2.0)

## 2014-08-22 LAB — DIFFERENTIAL
BASOS PCT: 0 % (ref 0–1)
Basophils Absolute: 0 10*3/uL (ref 0.0–0.1)
EOS ABS: 0 10*3/uL (ref 0.0–0.7)
Eosinophils Relative: 0 % (ref 0–5)
Lymphocytes Relative: 4 % — ABNORMAL LOW (ref 12–46)
Lymphs Abs: 0.2 10*3/uL — ABNORMAL LOW (ref 0.7–4.0)
Monocytes Absolute: 0.5 10*3/uL (ref 0.1–1.0)
Monocytes Relative: 8 % (ref 3–12)
Neutro Abs: 5.2 10*3/uL (ref 1.7–7.7)
Neutrophils Relative %: 88 % — ABNORMAL HIGH (ref 43–77)

## 2014-08-22 LAB — I-STAT TROPONIN, ED: Troponin i, poc: 0.02 ng/mL (ref 0.00–0.08)

## 2014-08-22 LAB — GLUCOSE, CAPILLARY
Glucose-Capillary: 163 mg/dL — ABNORMAL HIGH (ref 65–99)
Glucose-Capillary: 249 mg/dL — ABNORMAL HIGH (ref 65–99)

## 2014-08-22 LAB — TROPONIN I
Troponin I: 0.04 ng/mL — ABNORMAL HIGH (ref <0.031)
Troponin I: 0.04 ng/mL — ABNORMAL HIGH (ref <0.031)

## 2014-08-22 LAB — TSH: TSH: 1.764 u[IU]/mL (ref 0.350–4.500)

## 2014-08-22 LAB — PROTIME-INR
INR: 3.01 — ABNORMAL HIGH (ref 0.00–1.49)
PROTHROMBIN TIME: 30.7 s — AB (ref 11.6–15.2)

## 2014-08-22 LAB — I-STAT CG4 LACTIC ACID, ED: LACTIC ACID, VENOUS: 3.48 mmol/L — AB (ref 0.5–2.0)

## 2014-08-22 LAB — MRSA PCR SCREENING: MRSA BY PCR: NEGATIVE

## 2014-08-22 MED ORDER — LEVOTHYROXINE SODIUM 50 MCG PO TABS: 50.0000 ug | ORAL_TABLET | Freq: Every day | ORAL | Status: DC

## 2014-08-22 MED ORDER — PIPERACILLIN-TAZOBACTAM 3.375 G IVPB
3.3750 g | Freq: Three times a day (TID) | INTRAVENOUS | Status: DC
  Administered 2014-08-22 – 2014-08-25 (×9): 3.375 g via INTRAVENOUS
  Filled 2014-08-22 (×12): qty 50

## 2014-08-22 MED ORDER — LEVALBUTEROL HCL 0.63 MG/3ML IN NEBU
INHALATION_SOLUTION | RESPIRATORY_TRACT | Status: AC
  Administered 2014-08-22: 0.63 mg via RESPIRATORY_TRACT
  Filled 2014-08-22: qty 3

## 2014-08-22 MED ORDER — DILTIAZEM 12 MG/ML ORAL SUSPENSION
30.0000 mg | Freq: Three times a day (TID) | ORAL | Status: DC
  Administered 2014-08-22: 30 mg
  Filled 2014-08-22 (×3): qty 3

## 2014-08-22 MED ORDER — IPRATROPIUM BROMIDE 0.02 % IN SOLN
0.5000 mg | RESPIRATORY_TRACT | Status: DC
  Administered 2014-08-22 – 2014-08-23 (×8): 0.5 mg via RESPIRATORY_TRACT
  Filled 2014-08-22 (×7): qty 2.5

## 2014-08-22 MED ORDER — WARFARIN - PHARMACIST DOSING INPATIENT
Freq: Every day | Status: DC
  Administered 2014-08-28: 19:00:00

## 2014-08-22 MED ORDER — POTASSIUM CHLORIDE ER 10 MEQ PO TBCR
20.0000 meq | EXTENDED_RELEASE_TABLET | Freq: Two times a day (BID) | ORAL | Status: DC
  Administered 2014-08-22: 20 meq via ORAL
  Filled 2014-08-22 (×2): qty 2

## 2014-08-22 MED ORDER — SODIUM CHLORIDE 0.9 % IJ SOLN
3.0000 mL | Freq: Two times a day (BID) | INTRAMUSCULAR | Status: DC
  Administered 2014-08-22 – 2014-08-23 (×3): 3 mL via INTRAVENOUS

## 2014-08-22 MED ORDER — FERROUS SULFATE 300 (60 FE) MG/5ML PO SYRP
300.0000 mg | ORAL_SOLUTION | Freq: Every day | ORAL | Status: DC
  Administered 2014-08-22 – 2014-08-28 (×7): 300 mg
  Filled 2014-08-22 (×8): qty 5

## 2014-08-22 MED ORDER — AMIODARONE LOAD VIA INFUSION
150.0000 mg | Freq: Once | INTRAVENOUS | Status: AC
  Administered 2014-08-22: 150 mg via INTRAVENOUS
  Filled 2014-08-22: qty 83.34

## 2014-08-22 MED ORDER — OXYBUTYNIN CHLORIDE 5 MG PO TABS
5.0000 mg | ORAL_TABLET | Freq: Two times a day (BID) | ORAL | Status: DC
  Administered 2014-08-22 (×2): 5 mg
  Filled 2014-08-22 (×4): qty 1

## 2014-08-22 MED ORDER — ONDANSETRON HCL 4 MG/2ML IJ SOLN
4.0000 mg | Freq: Four times a day (QID) | INTRAMUSCULAR | Status: DC | PRN
  Administered 2014-08-25: 4 mg via INTRAVENOUS
  Filled 2014-08-22: qty 2

## 2014-08-22 MED ORDER — ONDANSETRON HCL 4 MG PO TABS: 4.0000 mg | ORAL_TABLET | Freq: Four times a day (QID) | ORAL | Status: DC | PRN

## 2014-08-22 MED ORDER — LEVALBUTEROL HCL 0.63 MG/3ML IN NEBU
0.6300 mg | INHALATION_SOLUTION | RESPIRATORY_TRACT | Status: DC | PRN
  Administered 2014-08-22 – 2014-08-23 (×2): 0.63 mg via RESPIRATORY_TRACT
  Filled 2014-08-22: qty 3

## 2014-08-22 MED ORDER — POTASSIUM CHLORIDE 20 MEQ/15ML (10%) PO SOLN
20.0000 meq | Freq: Two times a day (BID) | ORAL | Status: DC
  Administered 2014-08-22 – 2014-08-23 (×2): 20 meq
  Filled 2014-08-22 (×3): qty 15

## 2014-08-22 MED ORDER — FUROSEMIDE 10 MG/ML IJ SOLN
40.0000 mg | Freq: Once | INTRAMUSCULAR | Status: AC
  Administered 2014-08-22: 40 mg via INTRAVENOUS
  Filled 2014-08-22: qty 4

## 2014-08-22 MED ORDER — PIPERACILLIN-TAZOBACTAM 3.375 G IVPB 30 MIN
3.3750 g | Freq: Once | INTRAVENOUS | Status: AC
  Administered 2014-08-22: 3.375 g via INTRAVENOUS
  Filled 2014-08-22: qty 50

## 2014-08-22 MED ORDER — METHYLPREDNISOLONE SODIUM SUCC 125 MG IJ SOLR
60.0000 mg | Freq: Four times a day (QID) | INTRAMUSCULAR | Status: DC
  Administered 2014-08-22 – 2014-08-23 (×5): 60 mg via INTRAVENOUS
  Filled 2014-08-22 (×2): qty 0.96
  Filled 2014-08-22 (×3): qty 2
  Filled 2014-08-22 (×2): qty 0.96

## 2014-08-22 MED ORDER — TRAMADOL HCL 50 MG PO TABS
50.0000 mg | ORAL_TABLET | Freq: Four times a day (QID) | ORAL | Status: DC | PRN
  Administered 2014-08-22 – 2014-08-24 (×2): 50 mg via ORAL
  Filled 2014-08-22 (×2): qty 1

## 2014-08-22 MED ORDER — AMIODARONE HCL IN DEXTROSE 360-4.14 MG/200ML-% IV SOLN
60.0000 mg/h | INTRAVENOUS | Status: DC
  Administered 2014-08-22: 60 mg/h via INTRAVENOUS
  Filled 2014-08-22 (×2): qty 200

## 2014-08-22 MED ORDER — SODIUM CHLORIDE 0.9 % IJ SOLN: 3.0000 mL | INTRAMUSCULAR | Status: DC | PRN

## 2014-08-22 MED ORDER — COMPLETE NUTRITION PO LIQD: Freq: Four times a day (QID) | ORAL | Status: DC

## 2014-08-22 MED ORDER — GUAIFENESIN 100 MG/5ML PO SYRP
400.0000 mg | ORAL_SOLUTION | Freq: Three times a day (TID) | ORAL | Status: DC
  Administered 2014-08-22 – 2014-08-29 (×20): 400 mg
  Filled 2014-08-22 (×26): qty 20

## 2014-08-22 MED ORDER — BUDESONIDE 0.5 MG/2ML IN SUSP
0.5000 mg | Freq: Two times a day (BID) | RESPIRATORY_TRACT | Status: DC
  Administered 2014-08-22 – 2014-08-25 (×7): 0.5 mg via RESPIRATORY_TRACT
  Filled 2014-08-22 (×9): qty 2

## 2014-08-22 MED ORDER — METHYLPREDNISOLONE SODIUM SUCC 125 MG IJ SOLR
125.0000 mg | Freq: Once | INTRAMUSCULAR | Status: AC
  Administered 2014-08-22: 125 mg via INTRAVENOUS
  Filled 2014-08-22: qty 2

## 2014-08-22 MED ORDER — HYDROCODONE-ACETAMINOPHEN 5-325 MG PO TABS
1.0000 | ORAL_TABLET | Freq: Four times a day (QID) | ORAL | Status: DC | PRN
  Administered 2014-08-24: 1
  Filled 2014-08-22: qty 1

## 2014-08-22 MED ORDER — FERROUS SULFATE 220 (44 FE) MG/5ML PO ELIX
300.0000 mg | ORAL_SOLUTION | Freq: Every day | ORAL | Status: DC
  Filled 2014-08-22: qty 6.9

## 2014-08-22 MED ORDER — FUROSEMIDE 10 MG/ML IJ SOLN
40.0000 mg | Freq: Two times a day (BID) | INTRAMUSCULAR | Status: AC
  Administered 2014-08-22 – 2014-08-23 (×3): 40 mg via INTRAVENOUS
  Filled 2014-08-22 (×3): qty 4

## 2014-08-22 MED ORDER — LEVALBUTEROL HCL 1.25 MG/0.5ML IN NEBU
1.2500 mg | INHALATION_SOLUTION | RESPIRATORY_TRACT | Status: DC
  Administered 2014-08-22 – 2014-08-23 (×6): 1.25 mg via RESPIRATORY_TRACT
  Filled 2014-08-22 (×12): qty 0.5

## 2014-08-22 MED ORDER — AMIODARONE HCL IN DEXTROSE 360-4.14 MG/200ML-% IV SOLN
30.0000 mg/h | INTRAVENOUS | Status: DC
  Administered 2014-08-22 – 2014-08-23 (×2): 30 mg/h via INTRAVENOUS
  Filled 2014-08-22 (×4): qty 200

## 2014-08-22 MED ORDER — ACETAMINOPHEN 325 MG PO TABS
650.0000 mg | ORAL_TABLET | Freq: Four times a day (QID) | ORAL | Status: DC | PRN
  Administered 2014-08-26: 650 mg
  Filled 2014-08-22: qty 2

## 2014-08-22 MED ORDER — OSMOLITE 1.5 CAL PO LIQD
237.0000 mL | Freq: Every day | ORAL | Status: DC
  Administered 2014-08-22 – 2014-08-29 (×32): 237 mL
  Filled 2014-08-22 (×51): qty 237

## 2014-08-22 MED ORDER — VANCOMYCIN HCL 500 MG IV SOLR
500.0000 mg | INTRAVENOUS | Status: DC
  Administered 2014-08-23 – 2014-08-25 (×3): 500 mg via INTRAVENOUS
  Filled 2014-08-22 (×3): qty 500

## 2014-08-22 MED ORDER — IPRATROPIUM BROMIDE 0.02 % IN SOLN
RESPIRATORY_TRACT | Status: AC
  Administered 2014-08-22: 0.5 mg via RESPIRATORY_TRACT
  Filled 2014-08-22: qty 2.5

## 2014-08-22 MED ORDER — ACETAMINOPHEN 650 MG RE SUPP
650.0000 mg | Freq: Four times a day (QID) | RECTAL | Status: DC | PRN
  Administered 2014-08-30: 650 mg via RECTAL
  Filled 2014-08-22: qty 1

## 2014-08-22 MED ORDER — VANCOMYCIN HCL IN DEXTROSE 1-5 GM/200ML-% IV SOLN
1000.0000 mg | Freq: Once | INTRAVENOUS | Status: AC
  Administered 2014-08-22: 1000 mg via INTRAVENOUS
  Filled 2014-08-22: qty 200

## 2014-08-22 MED ORDER — LEVOTHYROXINE SODIUM 50 MCG PO TABS
50.0000 ug | ORAL_TABLET | Freq: Every day | ORAL | Status: DC
  Administered 2014-08-22 – 2014-08-29 (×8): 50 ug
  Filled 2014-08-22 (×10): qty 1

## 2014-08-22 MED ORDER — JEVITY 1.2 CAL PO LIQD: 1000.0000 mL | ORAL | Status: DC

## 2014-08-22 MED ORDER — SODIUM CHLORIDE 0.9 % IV SOLN: 250.0000 mL | INTRAVENOUS | Status: DC | PRN

## 2014-08-22 MED ORDER — WARFARIN SODIUM 2.5 MG PO TABS
2.5000 mg | ORAL_TABLET | Freq: Once | ORAL | Status: AC
  Administered 2014-08-22: 2.5 mg via ORAL
  Filled 2014-08-22: qty 1

## 2014-08-22 NOTE — Evaluation (Signed)
Physical Therapy Evaluation Patient Details Name: Tamara Bailey MRN: [id number] DOB: 31-Mar-1929 Today's Date: 25-Aug-2014   History of Present Illness  Tamara Bailey is a 81 y.o. 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 Patient had a left hip repair 08/14/13. Presented with acute shortness of breath and difficulty breathing chest x-ray showed pulmonary edema with bilateral pleural effusions  Clinical Impression  Pt pleasant with decreased ability to provide PLOF and requires assist for all mobility at this time. Pt with HR 115-135 with activity with sats 91-93% on RA with limited movement. Pt incontinent of urine and mobility limited by urination and need for pericare at this time. Pt has been walking at SNF and desires to return to increased mobility who will benefit from acute therapy to maximize strength and function with daily mobility per nursing.     Follow Up Recommendations SNF    Equipment Recommendations  None recommended by PT    Recommendations for Other Services       Precautions / Restrictions Precautions Precautions: Fall Precaution Comments: incontinent Restrictions Weight Bearing Restrictions: No      Mobility  Bed Mobility Overal bed mobility: Needs Assistance Bed Mobility: Rolling;Supine to Sit;Sit to Supine Rolling: Min assist   Supine to sit: Min assist Sit to supine: Min assist      Transfers Overall transfer level: Needs assistance   Transfers: Sit to/from [employer] Transfers Sit to Stand: Mod assist Stand pivot transfers: Mod assist       General transfer comment: cues for sequence, safety with pt maintaining flexed posture, unable to push with LUE due to flexion contracture. Pt incontinent with transfers to chair. Repeated incontinence with coughing in chair and  required return pivot bed to chair only 5 min later for pericare  Ambulation/Gait                Stairs            Wheelchair Mobility    Modified Rankin (Stroke Patients Only)       Balance Overall balance assessment: Needs assistance   Sitting balance-Leahy Scale: Fair       Standing balance-Leahy Scale: Poor                               Pertinent Vitals/Pain Pain Assessment: No/denies pain    Home Living Family/patient expects to be discharged to:: Skilled nursing facility                      Prior Function Level of Independence: Needs assistance   Gait / Transfers Assistance Needed: pt and staff state she has been walking grossly 59' with assist, 1 person assist to transfer  ADL's / Homemaking Assistance Needed: per staff total assist for bathing and dressing , normally wears an adult brief due to incontinence, staff state they feed her a pureed diet but that she has a tendency to vomit it, Pt has PEG tube        Hand Dominance        Extremity/Trunk Assessment   Upper Extremity Assessment: LUE deficits/detail;RUE deficits/detail RUE Deficits / Details: generally weak     LUE Deficits / Details: hemiparesis with Left hand flixed in flexion and unable to open with PROM   Lower Extremity Assessment:  Generalized weakness      Cervical / Trunk Assessment: Kyphotic  Communication   Communication: Expressive difficulties  Cognition Arousal/Alertness: Awake/alert   Overall Cognitive Status: Impaired/Different from baseline Area of Impairment: Orientation Orientation Level: Disoriented to;Time   Memory: Decreased short-term memory              General Comments      Exercises        Assessment/Plan    PT Assessment Patient needs continued PT services  PT Diagnosis Difficulty walking;Generalized weakness;Altered mental status   PT Problem List Decreased strength;Decreased activity tolerance;Decreased  cognition;Decreased safety awareness;Decreased balance;Decreased mobility;Cardiopulmonary status limiting activity  PT Treatment Interventions Gait training;DME instruction;Functional mobility training;Therapeutic activities;Therapeutic exercise;Balance training;Patient/family education   PT Goals (Current goals can be found in the Care Plan section) Acute Rehab PT Goals Patient Stated Goal[facility] PT Goal Formulation: With patient Time For Goal Achievement: 09/05/14 Potential to Achieve Goals: Fair    Frequency Min 2X/week   Barriers to discharge        Co-evaluation               End of Session   Activity Tolerance: Patient tolerated treatment well Patient left: in bed;with call bell/phone within reach;with bed alarm set Nurse Communication: Mobility status         Time: 1341-1410 PT Time Calculation (min) (ACUTE ONLY): 29 min   Charges:   PT Evaluation $Initial PT Evaluation Tier I: 1 Procedure PT Treatments $Therapeutic Activity: 8-22 mins   PT G CodesDelorse Lek:        Tabor, Maridee Slape Beth 14-Oct-2014, 2:45 PM Delaney MeigsMaija Tabor Thermon Zulauf, PT [phone]

## 2014-08-22 NOTE — Progress Notes (Addendum)
ANTIBIOTIC CONSULT NOTE - INITIAL  Pharmacy Consult for Zosyn and vancomycin Indication: pneumonia  Allergies  Allergen Reactions  . Morphine And Related Nausea And Vomiting    Patient Measurements: TBW ~50kg in 01/2014.  Vital Signs: Temp: 98.8 F (37.1 C) (06/28 0619) Temp Source: Rectal (06/28 0619) BP: 121/91 mmHg (06/28 0815) Pulse Rate: 112 (06/28 0815) Intake/Output from previous day:   Intake/Output from this shift:    Labs:  Recent Labs  02-18-2015 0600  WBC 6.0  HGB 12.6  PLT 206   CrCl cannot be calculated (Unknown ideal weight.). No results for input(s): VANCOTROUGH, VANCOPEAK, VANCORANDOM, GENTTROUGH, GENTPEAK, GENTRANDOM, TOBRATROUGH, TOBRAPEAK, TOBRARND, AMIKACINPEAK, AMIKACINTROU, AMIKACIN in the last 72 hours.   Microbiology: No results found for this or any previous visit (from the past 720 hour(s)).  Medical History: Past Medical History  Diagnosis Date  . Hypertension   . Stroke   . Atrial fibrillation     Assessment: 81 yo F presents on 6/28 with SOB and knee pain. Reports of suctioning white material from mouth which is concerning for possible aspiration PNA. Pharmacy to dose vancomycin and zosyn for HCAP. CXR shows diffuse pulmonary edema and bilateral pleural effusions. Afebrile, WBC wnl. SCr 0.5, CrCl ~30-3035ml/min.  Goal of Therapy:  Vancomycin trough level 15-20 mcg/ml  Resolution of infection  Plan:  Give vancomycin 1g IV x1, then start 500mg  IV Q24 tomorrow Give Zosyn 3.375g IV (30 min infusion) x 1, then start 3.375g IV Q8 (EI) Monitor clinical picture, renal function F/U updated weight, abx deescalation  BATCHELDER,NATHAN J 02/05/15,8:37 AM   Addendum: Continuing coumadin. INR is slightly above goal at 3.01. No bleeding noted.  Plan: - Give reduced dose of 2.5mg  warfarin tonight - Daily INR  Lysle Pearlachel Anetha Slagel, PharmD, BCPS Pager # [phone] 02/05/15 12:04 PM

## 2014-08-22 NOTE — ED Notes (Signed)
Dr. Glick at bedside with the patient.   

## 2014-08-22 NOTE — Evaluation (Signed)
Occupational Therapy Evaluation Patient Details Name: Tamara Bailey MRN: [id number] DOB: [birth year] Today's Date: September 08, 2014    History of Present Illness Tamara Bailey is a 81 y.o. 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 Patient had a left hip repair 08/14/13. Presented with acute shortness of breath and difficulty breathing chest x-ray showed pulmonary edema with bilateral pleural effusions   Clinical Impression   Pt is dependent in all ADL at baseline.  She demonstrates longstanding L UE hemiplegia with increased flexor tone.  Performed hand hygiene and applied palm protector, tolerated well.  RN notified. No further acute OT needs.    Follow Up Recommendations  SNF    Equipment Recommendations       Recommendations for Other Services       Precautions / Restrictions Precautions Precautions: Fall Precaution Comments: incontinent Restrictions Weight Bearing Restrictions: No      Mobility Bed Mobility Overal bed mobility: Needs Assistance Bed Mobility: Rolling;Supine to Sit;Sit to Supine Rolling: Min assist     Assisted pt to roll for pericare and changing of wet bedding.    Transfers            Balance                                         ADL Overall ADL's : At baseline                                             Vision     Perception     Praxis      Pertinent Vitals/Pain Pain Assessment: No/denies pain     Hand Dominance Right   Extremity/Trunk Assessment Upper Extremity Assessment Upper Extremity Assessment: LUE deficits/detail;RUE deficits/detail RUE Deficits / Details: generally weak LUE Deficits / Details: longstanding L hemiparesis with increased flexor tone and flexed hand, adducted thumb, able to range with slow,  gentle PROM to apply a palm protector, pt reports she has a cylindrical type splint at [employer] LUE Coordination: decreased fine motor;decreased gross motor (no functional use)   Lower Extremity Assessment Lower Extremity Assessment: Defer to PT evaluation   Cervical / Trunk Assessment Cervical / Trunk Assessment: Kyphotic   Communication Communication Communication: Expressive difficulties   Cognition Arousal/Alertness: Awake/alert Behavior During Therapy: WFL for tasks assessed/performed Overall Cognitive Status: History of cognitive impairments - at baseline Area of Impairment: Orientation Orientation Level: Disoriented to;Time   Memory: Decreased short-term memory             General Comments       Exercises       Shoulder Instructions      Home Living Family/patient expects to be discharged to:: Skilled nursing facility                                        Prior Functioning/Environment Level of Independence: Needs assistance  Gait / Transfers Assistance Needed: pt and staff state she has been walking grossly 68' with assist, 1 person assist to transfer ADL's / Homemaking Assistance Needed: per staff  total assist for bathing and dressing , normally wears an adult brief due to incontinence, staff state they feed her a pureed diet but that she has a tendency to vomit it, Pt has PEG tube        OT Diagnosis:     OT Problem List:     OT Treatment/Interventions:      OT Goals(Current goals can be found in the care plan section) Acute Rehab OT Goals Patient Stated Goal[facility]  OT Frequency:     Barriers to D/C:            Co-evaluation              End of Session Nurse Communication: Other (comment) (palm protector applied, hand care)  Activity Tolerance: Patient tolerated treatment well Patient left: in bed;with call bell/phone within reach;with nursing/sitter in room   Time: [id number] OT Time Calculation  (min): 20 min Charges:  OT General Charges $OT Visit: 1 Procedure OT Evaluation $Initial OT Evaluation Tier I: 1 Procedure G-Codes:    Evern BioMayberry, Delontae Lamm Lynn 03-09-14, 3:32 PM [phone]

## 2014-08-22 NOTE — Progress Notes (Addendum)
Initial Nutrition Assessment  DOCUMENTATION CODES:  Not applicable  INTERVENTION:   Continue Osmolite 1.5 formula: 237 ml 5 times daily via PEG tube  Total TF regimen to provide 1775 kcals, 74 gm protein, 905 ml of free water  NUTRITION DIAGNOSIS:  Inadequate oral intake related to dysphagia as evidenced by  (supplemental TF via PEG tube)  GOAL:  Patient will meet greater than or equal to 90% of their needs  MONITOR:  TF tolerance, Labs, Weight trends, I & O's, Diet advancement  REASON FOR ASSESSMENT:  Consult Enteral/tube feeding initiation and management  ASSESSMENT: 81 y.o. Female with PMH of atrial fibrillation on Coumadin, dementia, prior CVA, presented with acute shortness of breath and difficulty breathing. Patient recently had a right hip repair. Chest x-ray showed pulmonary edema with bilateral pleural effusions, BNP 1058.  [facility]  Receives TF regimen of Osmolite 1.5 formula (237 ml) 5 times daily via PEG tube which provides 1775 kcals, 74 gm protein, 905 ml of free water.  Per H&P review, pt does take PO diet as well.  Speech Path consult for swallow evaluation.   RD unable to complete Nutrition Focused Physical Exam at this time.  Height:  Ht Readings from Last 1 Encounters:  08/14/2014 5\' 2"  (1.575 m)    Weight:  Wt Readings from Last 1 Encounters:  08/15/2014 119 lb 11.4 oz (54.3 kg)    Ideal Body Weight:  50 kg  Wt Readings from Last 10 Encounters:  08/21/2014 119 lb 11.4 oz (54.3 kg)  02/23/14 107 lb (48.535 kg)  08/13/13 94 lb 2.2 oz (42.7 kg)    BMI:  Body mass index is 21.89 kg/(m^2).  Estimated Nutritional Needs:  Kcal:  1600-1800  Protein:  70-80 gm  Fluid:  1.6-1.8 L  Skin:  Reviewed, no issues  Diet Order:  Diet NPO time specified  EDUCATION NEEDS:  No education needs identified at this time   Intake/Output Summary (Last 24 hours) at 08/01/2014 1419 Last data filed at 07/30/2014 1119  Gross per 24 hour   Intake    330 ml  Output      0 ml  Net    330 ml    Last BM:  6/27  Maureen ChattersKatie Mohamed Portlock, RD, LDN Pager #: [phone] After-Hours Pager #: [phone]

## 2014-08-22 NOTE — ED Notes (Signed)
PA York made aware that patients heart rate goes from 100 - 140 and patient will maintain for approximately 20 seconds and will drop back down.  Cardizem released from signed and held and send down from main pharmacy.

## 2014-08-22 NOTE — Progress Notes (Signed)
Amiodarone Drug - Drug Interaction Consult Note  Recommendations:  Monitor INR (warfarin + amio can increase INR - interaction can take several days) and electrolytes closely.   Amiodarone is metabolized by the cytochrome P450 system and therefore has the potential to cause many drug interactions. Amiodarone has an average plasma half-life of 50 days (range 20 to 100 days).   There is potential for drug interactions to occur several weeks or months after stopping treatment and the onset of drug interactions may be slow after initiating amiodarone.   []  Statins: Increased risk of myopathy. Simvastatin- restrict dose to 20mg  daily. Other statins: counsel patients to report any muscle pain or weakness immediately.  [x]  Anticoagulants: Amiodarone can increase anticoagulant effect. Consider warfarin dose reduction. Patients should be monitored closely and the dose of anticoagulant altered accordingly, remembering that amiodarone levels take several weeks to stabilize.  []  Antiepileptics: Amiodarone can increase plasma concentration of phenytoin, the dose should be reduced. Note that small changes in phenytoin dose can result in large changes in levels. Monitor patient and counsel on signs of toxicity.  []  Beta blockers: increased risk of bradycardia, AV block and myocardial depression. Sotalol - avoid concomitant use.  []   Calcium channel blockers (diltiazem and verapamil): increased risk of bradycardia, AV block and myocardial depression.  []   Cyclosporine: Amiodarone increases levels of cyclosporine. Reduced dose of cyclosporine is recommended.  []  Digoxin dose should be halved when amiodarone is started.  [x]  Diuretics: increased risk of cardiotoxicity if hypokalemia occurs.  []  Oral hypoglycemic agents (glyburide, glipizide, glimepiride): increased risk of hypoglycemia. Patient's glucose levels should be monitored closely when initiating amiodarone therapy.   []  Drugs that prolong the QT  interval:  Torsades de pointes risk may be increased with concurrent use - avoid if possible.  Monitor QTc, also keep magnesium/potassium WNL if concurrent therapy can't be avoided. Marland Kitchen. Antibiotics: e.g. fluoroquinolones, erythromycin. . Antiarrhythmics: e.g. quinidine, procainamide, disopyramide, sotalol. . Antipsychotics: e.g. phenothiazines, haloperidol.  . Lithium, tricyclic antidepressants, and methadone.  Thank You,  Simrah Chatham, Drake LeachRachel Lynn  07/27/2014 3:49 PM

## 2014-08-22 NOTE — ED Notes (Signed)
Spoke with patients family member, Bayard Beaverva Moody at [phone] to provide update on patient and where patient will be going from the ED.

## 2014-08-22 NOTE — ED Notes (Signed)
Per Dr. Preston FleetingGlick, patient taken off of BiPAP and placed on 2L nasal cannula.

## 2014-08-22 NOTE — ED Provider Notes (Signed)
CSN: [id number]     Arrival date & time 07/27/2014  0553 History   First MD Initiated Contact with Patient 08/08/2014 [phone]     Chief Complaint  Patient presents with  . Shortness of Breath  . Knee Pain    right     (Consider location/radiation/quality/duration/timing/severity/associated sxs/prior Treatment) Patient is a 81 y.o. female presenting with shortness of breath and knee pain. The history is provided by the nursing home. The history is limited by the condition of the patient (Dementia).  Shortness of Breath Knee Pain She was reported to be extremely breathless at the nursing home where she resides. This started at 4 AM. Staff has reported suctioning about 100 mL of thick white secretions. Patient is unable to give any history other than that she is not feeling well. EMS arrived and put her on CPAP based on her breathlessness.  Past Medical History  Diagnosis Date  . Hypertension   . Stroke   . Atrial fibrillation    Past Surgical History  Procedure Laterality Date  . Femur im nail Left 08/13/2013    Procedure: INTRAMEDULLARY (IM) Hip Screw FEMORAL;  Surgeon: Cammy Copa, MD;  Location: [facility];  Service: Orthopedics;  Laterality: Left;   Family History  Problem Relation Age of Onset  . Diabetes     History  Substance Use Topics  . Smoking status: Never Smoker   . Smokeless tobacco: Not on file  . Alcohol Use: No   OB History    No data available     Review of Systems  Unable to perform ROS: Dementia  Respiratory: Positive for shortness of breath.       Allergies  Morphine and related  Home Medications   Prior to Admission medications   Medication Sig Start Date End Date Taking? Authorizing Provider  diltiazem (CARDIZEM) 30 MG tablet Take 1 tablet (30 mg total) by mouth every 6 (six) hours. 08/16/13   Nishant Dhungel, MD  ferrous sulfate 220 (44 FE) MG/5ML solution Take by mouth daily. 6-50ml every day    Historical Provider, MD  levothyroxine  (SYNTHROID, LEVOTHROID) 50 MCG tablet Take 50 mcg by mouth daily before breakfast.    Historical Provider, MD  menthol-cetylpyridinium (CEPACOL) 3 MG lozenge Take 1 lozenge (3 mg total) by mouth as needed for sore throat (sore throat). Patient not taking: Reported on 02/23/2014 08/16/13   Nishant Dhungel, MD  Nutritional Supplements (FEEDING SUPPLEMENT, OSMOLITE 1.5 CAL,) LIQD Place 237 mLs into feeding tube 5 (five) times daily. 08/16/13   Nishant Dhungel, MD  traMADol (ULTRAM) 50 MG tablet Take 1 tablet (50 mg total) by mouth every 6 (six) hours as needed. Patient taking differently: Take 50 mg by mouth every 6 (six) hours as needed for moderate pain.  11/11/13   Mancel Bale, MD  warfarin (COUMADIN) 4 MG tablet Take 4 mg by mouth daily. Alternating with 5mg  tablet    Historical Provider, MD  warfarin (COUMADIN) 5 MG tablet Take 5 mg by mouth daily. Alternating with 4mg  tablet    Historical Provider, MD   BP 130/75 mmHg  Pulse 119  Temp(Src) 98.8 F (37.1 C) (Rectal)  Resp 24  SpO2 100% Physical Exam  Nursing note and vitals reviewed.  81 year old female, resting comfortably on BiPAP , and in no acute distress. Vital signs arsignificant for tachycardia and tachypnea. Oxygen saturation is 100%, which is normal. Head is normocephalic and atraumatic. PERRLA, EOMI. Oropharynx is clear. Neck is nontender and supple without  adenopathy or JVD. Back is nontender and there is no CVA tenderness. Lungs are clear without rales, wheezes, or rhonchi. Chest is nontender. Heart has regular rate and rhythm without murmur. Abdomen is soft, flat, nontender without masses or hepatosplenomegaly and peristalsis is normoactive. PEG tube preseny in her abdomen.  Extremities have no cyanosis or edema, full range of motion is present. Skin is warm and dry without rash. Neurologic: Mental status is normal, cranial nerves are intact, there are no motor or sensory deficits.  ED Course  Procedures (including  critical care time) Labs Review Results for orders placed or performed during the hospital encounter of 07/28/2014  CBC  Result Value Ref Range   WBC 6.0 4.0 - 10.5 K/uL   RBC 4.34 3.87 - 5.11 MIL/uL   Hemoglobin 12.6 12.0 - 15.0 g/dL   HCT 16.139.5 09.636.0 - 04.546.0 %   MCV 91.0 78.0 - 100.0 fL   MCH 29.0 26.0 - 34.0 pg   MCHC 31.9 30.0 - 36.0 g/dL   RDW 40.916.0 (H) 81.111.5 - 91.415.5 %   Platelets 206 150 - 400 K/uL  BNP (order ONLY if patient complains of dyspnea/SOB AND you have documented it for THIS visit)  Result Value Ref Range   B Natriuretic Peptide 1058.7 (H) 0.0 - 100.0 pg/mL  Protime-INR (if pt is taking Coumadin)  Result Value Ref Range   Prothrombin Time 30.7 (H) 11.6 - 15.2 seconds   INR 3.01 (H) 0.00 - 1.49  Differential  Result Value Ref Range   Neutrophils Relative % 88 (H) 43 - 77 %   Neutro Abs 5.2 1.7 - 7.7 K/uL   Lymphocytes Relative 4 (L) 12 - 46 %   Lymphs Abs 0.2 (L) 0.7 - 4.0 K/uL   Monocytes Relative 8 3 - 12 %   Monocytes Absolute 0.5 0.1 - 1.0 K/uL   Eosinophils Relative 0 0 - 5 %   Eosinophils Absolute 0.0 0.0 - 0.7 K/uL   Basophils Relative 0 0 - 1 %   Basophils Absolute 0.0 0.0 - 0.1 K/uL  I-stat troponin, ED  ([facility])  Result Value Ref Range   Troponin i, poc 0.02 0.00 - 0.08 ng/mL   Comment 3          I-Stat CG4 Lactic Acid, ED  Result Value Ref Range   Lactic Acid, Venous 3.48 (HH) 0.5 - 2.0 mmol/L   Comment NOTIFIED PHYSICIAN    Imaging Review Dg Chest Port 1 View  08/20/2014   CLINICAL DATA:  Initial evaluation for acute shortness of breath.  EXAM: PORTABLE CHEST - 1 VIEW  COMPARISON:  Prior radiograph from 08/12/2013  FINDINGS: Cardiomegaly again seen, grossly stable. Mediastinal silhouette within normal limits. Atheromatous plaque present within the intrathoracic aorta. Metallic watch overlies of the upper abdomen.  Lungs are normally inflated. Moderate bilateral pleural effusions are present, left greater than right. There is diffuse pulmonary  vascular congestion with indistinctness of the interstitial markings, suggesting moderate diffuse pulmonary edema. No definite focal infiltrates. No pneumothorax.  No acute osseus abnormality. Surgical clips overlie the right axilla.  IMPRESSION: Cardiomegaly with moderate diffuse pulmonary edema and bilateral pleural effusions, left greater than right.   Electronically Signed   By: Rise MuBenjamin  McClintock M.D.   On: 08/14/2014 06:53   Images viewed by me.   EKG Interpretation   Date/Time:  Tuesday August 22 2014 06:05:46 EDT Ventricular Rate:  125 PR Interval:    QRS Duration: 131 QT Interval:  364 QTC  Calculation: 525 R Axis:   128 Text Interpretation:  Atrial fibrillation Right bundle branch block  Abnormal lateral Q waves When compared with ECG of 08/12/2013, No  significant change was found Confirmed by [facility]  MD, Antonya Leeder ([id number]) on  08/13/2014 6:17:53 AM      CRITICAL CARE Performed by: WUJWJ,XBJYN Total critical care time: 35 minutes Critical care time was exclusive of separately billable procedures and treating other patients. Critical care was necessary to treat or prevent imminent or life-threatening deterioration. Critical care was time spent personally by me on the following activities: development of treatment plan with patient and/or surrogate as well as nursing, discussions with consultants, evaluation of patient's response to treatment, examination of patient, obtaining history from patient or surrogate, ordering and performing treatments and interventions, ordering and review of laboratory studies, ordering and review of radiographic studies, pulse oximetry and re-evaluation of patient's condition.  MDM   Final diagnoses:  CHF exacerbation  Chronic atrial fibrillation  Elevated lactic acid level  Anticoagulated on warfarin   Acute dyspnea with report of suctioning white material from her mouth. This is concerning for possible aspiration pneumonia. Her underlying rhythm is  atrial fibrillation, so tachycardia is not necessarily a sign of sepsis and she has adequate blood pressure. She is tolerating BiPAP well. Will give her a trial off of BiPAP. Workup has been initiated including chest x-ray and laboratory work.   Chest x-ray is more consistent with CHF and aspiration. BNP is also significantly elevated. Lactic acid is slightly elevated. However, with findings of CHF, I feel that that is a more likely cause of the elevated lactic acid and would be a contraindication for aggressive fluid therapy. Patient has tolerated being off of BiPAP and still seems to be resting comfortably. INR is therapeutic. Remainder of laboratory workup is unremarkable. Case is discussed with Louie Bun of [facility] who agrees to admit the patient. Given her instability during the evening, she is admitted to a stepdown unit.   Dione Booze, MD 08/19/2014 [phone]

## 2014-08-22 NOTE — Consult Note (Signed)
CARDIOLOGY CONSULT NOTE   Patient ID: Tamara PouchMary Ellen Bailey MRN: [id number] DOB/AGE: 79/07/1929 79 y.o.  Admit date: 2014-09-12  Primary Physician   Ginette OttoSTONEKING,HAL THOMAS, MD Primary Cardiologist   New Reason for Consultation   CHF  OZH:[id number]:Tamara Bailey is a 81 y.o. year old female with a history of chronic Afib, CHad2Vasc2 score=5, CVA 2006, HTN, breast CA s/p chemo and, Peg tube for malnutrition, dementia, fall 06/19 w/ hip repair (D/C 06/23), DNR, SNF resident who was admitted from the [facility] today with shortness of breath.  The patient is awake but confused and not able to answer questions very well. Information was obtained from chart notes and records.  She had a left hip fracture with repair in June 2015 (not 2016). Has been at SNF since and has had failure to thrive. Brought to ER today with acute SOB. Found to have diffuse pulmonary edema with pleural effusions and lactic acidosis.  ECG showed AF with RVR  She has been treated with IV lasix, vanc/zosyn (?aspiration) and solumedrol. Breathing now much improved. Has been incontinent all over bed. Denies CP, fevers or chills to me but she is poor historian.    Past Medical History  Diagnosis Date  . Hypertension   . Stroke   . Atrial fibrillation   . Malnutrition   . DNR (do not resuscitate)      Past Surgical History  Procedure Laterality Date  . Femur im nail Left 08/13/2013    Procedure: INTRAMEDULLARY (IM) Hip Screw FEMORAL;  Surgeon: Cammy CopaGregory Scott Dean, MD;  Location: [facility];  Service: Orthopedics;  Laterality: Left;  . Peg tube placement     Allergies  Allergen Reactions  . Morphine And Related Nausea And Vomiting   I have reviewed the patient's current medications . diltiazem  30 mg Per Tube 3 times per day   . piperacillin-tazobactam (ZOSYN)  IV    . [START ON 08/23/2014] vancomycin     Medication Sig  atropine 1 % ophthalmic solution Place 1 drop under the tongue 3 (three) times daily as needed  (salivation).  diltiazem (CARDIZEM) 30 MG tablet Take 1 tablet (30 mg total) by mouth every 6 (six) hours.  ferrous sulfate 220 (44 FE) MG/5ML solution Place 300 mg into feeding tube daily. 6-248ml every day  HYDROcodone-acetaminophen (NORCO/VICODIN) 5-325 MG per tablet 1 tablet by Gastric Tube route every 6 (six) hours as needed for moderate pain.  levothyroxine (SYNTHROID, LEVOTHROID) 50 MCG tablet 50 mcg by Gastric Tube route daily before breakfast.   loperamide (IMODIUM A-D) 2 MG tablet 2 mg by Gastric Tube route daily as needed for diarrhea or loose stools.  loperamide (IMODIUM) 2 MG capsule Take 4 mg by mouth daily as needed for diarrhea or loose stools.  Melatonin 3 MG TABS Take 3 mg by mouth daily.  Nutritional Supplements (COMPLETE NUTRITION PO) Give by tube 4 (four) times daily. Two cal HN bolus tube feeding  oxybutynin (DITROPAN) 5 MG tablet 5 mg by Gastric Tube route 2 (two) times daily.  PRESCRIPTION MEDICATION 7.5 mLs by PEG Tube route daily. Fesoy  traMADol (ULTRAM) 50 MG tablet Take 1 tablet (50 mg total) by mouth every 6 (six) hours as needed. Patient taking differently: Take 50 mg by mouth every 6 (six) hours as needed for moderate pain.   Nutritional Supplements (FEEDING SUPPLEMENT, OSMOLITE 1.5 CAL,) LIQD Place 237 mLs into feeding tube 5 (five) times daily.  warfarin (COUMADIN) 4 MG tablet Take 4 mg  by mouth daily. Alternating with 5mg  tablet  warfarin (COUMADIN) 5 MG tablet Take 5 mg by mouth daily. Alternating with 4mg  tablet     History   Social History  . Marital Status: Widowed    Spouse Name: N/A  . Number of Children: N/A  . Years of Education: N/A   Occupational History  . Not on file.   Social History Main Topics  . Smoking status: Never Smoker   . Smokeless tobacco: Not on file  . Alcohol Use: No  . Drug Use: Not on file  . Sexual Activity: Not on file   Other Topics Concern  . Not on file   Social History Narrative   Patient came to the [place]  from [facility]. She is currently a resident in the [facility].    Family Status  Relation Status Death Age  . Mother Deceased   . Father Deceased    Family History  Problem Relation Age of Onset  . Diabetes       ROS:  Full 14 point review of systems complete and found to be negative unless listed above.  Physical Exam: Blood pressure 132/96, pulse 138, temperature 98.8 F (37.1 C), temperature source Rectal, resp. rate 24, SpO2 99 %.  General: Well developed, well nourished, female currently no resp distress Head: Eyes PERRLA, No xanthomas.   Normocephalic and atraumatic, oropharynx without edema or exudate. Dentition: Poor Lungs: Rales bases, right greater than left with slight expiratory wheeze Heart: Heart tachy  irregular rate and rhythm with S1, S2, no murmur. pulses are 2+ all 4 extrem.   Neck: No carotid bruits. No lymphadenopathy.  JVD to jaw  Abdomen: Bowel sounds present, abdomen soft and non-tender without masses or hernias noted.  PEG tube Msk:  No spine or cva tenderness. No weakness, no joint deformities or effusions. Extremities: No clubbing or cyanosis. No edema.  Neuro: Alert and oriented X 1. Left facial droop and L-sided weakness Speech is difficult to understand, suspect this is her baseline. Psych:  Anxious affect, responds appropriately Skin: No rashes or lesions noted.  Labs: BMET pending  Lab Results  Component Value Date   WBC 6.0 19-Sep-2014   HGB 12.6 09-19-2014   HCT 39.5 09/19/14   MCV 91.0 09-19-14   PLT 206 09-19-2014    Recent Labs  2014-09-19 0600  INR 3.01*    Recent Labs  19-Sep-2014 0618  TROPIPOC 0.02   B NATRIURETIC PEPTIDE  Date/Time Value Ref Range Status  2014-09-19 06:00 AM 1058.7* 0.0 - 100.0 pg/mL Final   TSH  Date/Time Value Ref Range Status  08/15/2013 10:37 AM 1.630 0.350 - 4.500 uIU/mL Final    Comment:    Performed at [facility]   Echo: Ordered  ECG:  Atrial fibrillation, rapid  ventricular response, right bundle branch block is old  Radiology:  Dg Chest Port 1 View 09/19/14   CLINICAL DATA:  Initial evaluation for acute shortness of breath.  EXAM: PORTABLE CHEST - 1 VIEW  COMPARISON:  Prior radiograph from 08/12/2013  FINDINGS: Cardiomegaly again seen, grossly stable. Mediastinal silhouette within normal limits. Atheromatous plaque present within the intrathoracic aorta. Metallic watch overlies of the upper abdomen.  Lungs are normally inflated. Moderate bilateral pleural effusions are present, left greater than right. There is diffuse pulmonary vascular congestion with indistinctness of the interstitial markings, suggesting moderate diffuse pulmonary edema. No definite focal infiltrates. No pneumothorax.  No acute osseus abnormality. Surgical clips overlie the right axilla.  IMPRESSION: Cardiomegaly with moderate diffuse pulmonary edema and bilateral pleural effusions, left greater than right.   Electronically Signed   By: Rise Mu M.D.   On: 09/09/2014 06:53    ASSESSMENT AND PLAN:   The patient was seen today by Dr. Gala Romney, the patient evaluated and the data reviewed.      Acute CHF - ? Diastolic vs systolic - No history of CHF, not on any diuretic prior to admission. - Echo ordered, will follow up on results - She will need diuresis, BMET not ordered initially, have ordered and will follow up on results - M.D. advise on Lasix 40 mg IV twice a day    ?HCAP (healthcare-associated pneumonia) - Per IM    Chronic Atrial fibrillation, now with rapid ventricular response - Think elevated heart rate is a response to her acute illness.     Chronic anticoagulation - Coumadin is therapeutic - This patients CHA2DS2-VASc Score and unadjusted Ischemic Stroke Rate (% per year) is equal to 7.2 % stroke rate/year from a score of 5 Above score calculated as 1 point each if present [CHF, HTN, DM, Vascular=MI/PAD/Aortic Plaque, Age if 39-74, or Female], 2 points  each if present [Age > 75, or Stroke/TIA/TE]    Acute respiratory failure - Per IM  SignedTheodore Demark, PA-C 09/09/2014 11:21 AM Beeper [phone]  Co-Sign MD  81 y/o woman with multiple medical issues including dementia, chronic AF, h/o CVA, vocal cord paralysis with PEG tube presents with acute decompensated HF with rapid AF and respiratory failure. Now much improved with diuresis. Also being treated for possible aspiration pneumonia.   Suspect main issue is HF but cannot exclude concomittant infection. RVR likely related to illness but also worsening HF. For now recommend: 1) Continue IV diuresis with lasix 40 IV bid 2) Check echo - concern for reduced EF 3) Stop cardizem - start IV amio for rate control. Continue coumadin 4) Place Foley  Bensimhon, Daniel,MD 2:36 PM

## 2014-08-22 NOTE — ED Notes (Addendum)
EMS - [facility] coming [facility] with c/o of SOB on going since 4am.  Patient was suctioned at the facility with approximately 100cc, secretions thick and white.  History of stroke (2006) and a fib.  Patient was on CPAP with EMS.  EMS states patient was c/o of right knee pain, undetermined for how long the pain has been ongoing for and what the bandage on the right leg is for.    Patient placed on BiPAP in the ED.

## 2014-08-22 NOTE — Evaluation (Signed)
Clinical/Bedside Swallow Evaluation Patient Details  Name: Tamara Bailey MRN: [id number] Date of Birth: 07/09/1929  Today's Date: 6/2[name] Time: SLP Start Time (ACUTE ONLY): 1351 SLP Stop Time (ACUTE ONLY): 1403 SLP Time Calculation (min) (ACUTE ONLY): 12 min  Past Medical History:  Past Medical History  Diagnosis Date  . Hypertension   . Stroke   . Atrial fibrillation   . Malnutrition   . DNR (do not resuscitate)    Past Surgical History:  Past Surgical History  Procedure Laterality Date  . Femur im nail Left 08/13/2013    Procedure: INTRAMEDULLARY (IM) Hip Screw FEMORAL;  Surgeon: Cammy CopaGregory Scott Dean, MD;  Location: [facility];  Service: Orthopedics;  Laterality: Left;  . Peg tube placement     HPI:  81 y.o. female, with A-fib, stroke, hip fx 2015, breast cancer admitted with shortness of breath. Found to have sepsis with acute respiratory failure secondary to age versus aspiration pneumonia in combination with acute on chronic congestive heart failure. MBS 02/2013 revealing significant residue throughout the pharynx markedly reduced CP relaxation, resulting in a minimal amount of bolus passing through to the esophagus and penetration and aspiration residue (bringing majority of bolus back up to the oral cavity. Chest x-ray shows pulmonary edema with bilateral pleural effusions. Pt states she eats "some" but frequency and amount was unclear.    Assessment / Plan / Recommendation Clinical Impression  Pt exhibited difficulty expelling audible pharyngeal mucous prior to consumption of ice chips and applesauce. Laryngeal elevation decreased during manual palpation and multiple swallows with applesauce increasing aspiration risk. Pt with PEG, clinical observations and paralyzed vocal quality warrant objective swallow assessment (FEES due to vocal cord involvement).    Aspiration Risk   (mod-severe)    Diet Recommendation NPO   Medication Administration: Via alternative means    Other   Recommendations Oral Care Recommendations: Oral care BID   Follow Up Recommendations       Frequency and Duration min 2x/week  2 weeks   Pertinent Vitals/Pain None          Swallow Study         Oral/Motor/Sensory Function Overall Oral Motor/Sensory Function: Appears within functional limits for tasks assessed   Ice Chips Ice chips: Impaired Presentation: Spoon Oral Phase Functional Implications: Prolonged oral transit Pharyngeal Phase Impairments: Decreased hyoid-laryngeal movement;Suspected delayed Swallow   Thin Liquid Thin Liquid: Not tested    Nectar Thick Nectar Thick Liquid: Not tested   Honey Thick Honey Thick Liquid: Not tested   Puree Puree: Impaired Pharyngeal Phase Impairments: Suspected delayed Swallow;Decreased hyoid-laryngeal movement   Solid   GO    Solid: Not tested       Tamara MacadamiaLitaker, Tamara Bailey 07/29/14,3:17 PM  Tamara Bailey Tamara Bailey Pager [phone]

## 2014-08-22 NOTE — H&P (Signed)
[facility] History and Physical                                                                                    Tamara Bailey, is a 81 y.o. female  MRN: [id number]   DOB - [birth year]  Admit Date - 2014/08/23  Outpatient Primary MD for the patient is Tamara Otto, MD  Prior Cardiologist:  Dr. Dorothe Bailey in [place], [place]  Referring Physician:  Dr. Preston Bailey  Chief Complaint:   Chief Complaint  Patient presents with  . Shortness of Breath  . Knee Pain    right     HPI  Tamara Bailey  is a 81 y.o. female, with afib on coumadin, dementia and stroke who presents this morning with shortness of breath that started suddenly at 4 AM. Per the EDP report thick white secretions were suctioned from her throat at [facility] home by EMS prior to coming to the ER. During my time with the patient she is working to breathe and is difficult to understand so history was limited. She denies any chest pain. She tells me that she recently had a right hip repair. As a matter fact she was discharged from the hospital on June, 23rd. She is on chronic Coumadin her INR is 3.01. She has a PEG tube in place for dysphagia, she has been receiving PEG tube feedings as well as oral feedings. Lactic acid is elevated 3.48, CBC shows elevated neutrophils.  Chest x-ray shows pulmonary edema with bilateral pleural effusions. BNP is 1058.7.  Per her sister Tamara Bailey [phone]) the patient is relatively new to the area having moved from a nursing home in [place]. She has been having difficulty with thick white secretions for a long period of time. She has been becoming progressively weaker over the last several months. She has been treated for cardiomyopathy that started after her chemotherapy treatment for breast cancer. Mrs. Tamara Bailey also mentions that she has an active upper respiratory infection and has recently visited her sister.  The patient will be admitted to stepdown for sepsis with congestive heart failure and  probable healthcare associated versus aspiration pneumonia.   Review of Systems   I'm unable to obtain a full review of systems as the patient is very short of breath and has difficulty speaking. She had initially reported right knee pain to the ED  Past Medical History  Past Medical History  Diagnosis Date  . Hypertension   . Stroke   . Atrial fibrillation     Past Surgical History  Procedure Laterality Date  . Femur im nail Left 08/13/2013    Procedure: INTRAMEDULLARY (IM) Hip Screw FEMORAL;  Surgeon: Tamara Copa, MD;  Location: [facility];  Service: Orthopedics;  Laterality: Left;      Social History History  Substance Use Topics  . Smoking status: Never Smoker   . Smokeless tobacco: Not on file  . Alcohol Use: No   lives at SNF  Family History Family History  Problem Relation Age of Onset  . Diabetes      Prior to Admission medications   Medication Sig Start Date End Date Taking? Authorizing Provider  atropine 1 % ophthalmic  solution Place 1 drop under the tongue 3 (three) times daily as needed (salivation).   Yes Historical Provider, MD  diltiazem (CARDIZEM) 30 MG tablet Take 1 tablet (30 mg total) by mouth every 6 (six) hours. 08/16/13  Yes Tamara Dhungel, MD  ferrous sulfate 220 (44 FE) MG/5ML solution Place 300 mg into feeding tube daily. 6-748ml every day   Yes Historical Provider, MD  HYDROcodone-acetaminophen (NORCO/VICODIN) 5-325 MG per tablet 1 tablet by Gastric Tube route every 6 (six) hours as needed for moderate pain.   Yes Historical Provider, MD  levothyroxine (SYNTHROID, LEVOTHROID) 50 MCG tablet 50 mcg by Gastric Tube route daily before breakfast.    Yes Historical Provider, MD  loperamide (IMODIUM A-D) 2 MG tablet 2 mg by Gastric Tube route daily as needed for diarrhea or loose stools.   Yes Historical Provider, MD  loperamide (IMODIUM) 2 MG capsule Take 4 mg by mouth daily as needed for diarrhea or loose stools.   Yes Historical Provider, MD   Melatonin 3 MG TABS Take 3 mg by mouth daily.   Yes Historical Provider, MD  Nutritional Supplements (COMPLETE NUTRITION PO) Give by tube 4 (four) times daily. Two cal HN bolus tube feeding   Yes Historical Provider, MD  oxybutynin (DITROPAN) 5 MG tablet 5 mg by Gastric Tube route 2 (two) times daily.   Yes Historical Provider, MD  PRESCRIPTION MEDICATION 7.5 mLs by PEG Tube route daily. Fesoy   Yes Historical Provider, MD  traMADol (ULTRAM) 50 MG tablet Take 1 tablet (50 mg total) by mouth every 6 (six) hours as needed. Patient taking differently: Take 50 mg by mouth every 6 (six) hours as needed for moderate pain.  11/11/13  Yes Tamara BaleElliott Wentz, MD  Nutritional Supplements (FEEDING SUPPLEMENT, OSMOLITE 1.5 CAL,) LIQD Place 237 mLs into feeding tube 5 (five) times daily. 08/16/13   Tamara Dhungel, MD  warfarin (COUMADIN) 4 MG tablet Take 4 mg by mouth daily. Alternating with 5mg  tablet    Historical Provider, MD  warfarin (COUMADIN) 5 MG tablet Take 5 mg by mouth daily. Alternating with 4mg  tablet    Historical Provider, MD    Allergies  Allergen Reactions  . Morphine And Related Nausea And Vomiting    Physical Exam  Vitals  Blood pressure 143/96, pulse 118, temperature 98.8 F (37.1 C), temperature source Rectal, resp. rate 28, SpO2 98 %.   General:  Frail, ill-appearing, elderly female lying in bed with increased work of breathing  Psych:  She has difficult he speaking due to shortness of breath, but is able to tell me who she isn't where she is.  Neuro:   Follows commands. Has no obvious focal neuro deficits.  ENT:  Ears and Eyes appear Normal, Conjunctivae clear, PER. Oral mucosa is very dry.  Neck:  Supple, No lymphadenopathy appreciated, thick extended neck veins.  Respiratory:  Increased work of breathing, wet sounding to auscultation.  Cardiac:  Tachycardic, No Murmurs, + right lower extremity edema noted (+1-2)    Abdomen:  Positive bowel sounds, Soft, Non tender, Non  distended,  No masses appreciated, PEG in place no signs of infection at the site.  Skin:  No Cyanosis, Normal Skin Turgor, No Skin Rash or Bruise. No sacral ulcerations.  Extremities:  Able to move all 4. 5/5 strength in each,  no effusions. Generalized weakness.  Data Review  CBC  Recent Labs Lab December 16, 2014 0600  WBC 6.0  HGB 12.6  HCT 39.5  PLT 206  MCV 91.0  MCH 29.0  MCHC 31.9  RDW 16.0*  LYMPHSABS 0.2*  MONOABS 0.5  EOSABS 0.0  BASOSABS 0.0     Coagulation profile  Recent Labs Lab 08/13/2014 0600  INR 3.01*     Imaging results:   Dg Chest Port 1 View  08/13/2014   CLINICAL DATA:  Initial evaluation for acute shortness of breath.  EXAM: PORTABLE CHEST - 1 VIEW  COMPARISON:  Prior radiograph from 08/12/2013  FINDINGS: Cardiomegaly again seen, grossly stable. Mediastinal silhouette within normal limits. Atheromatous plaque present within the intrathoracic aorta. Metallic watch overlies of the upper abdomen.  Lungs are normally inflated. Moderate bilateral pleural effusions are present, left greater than right. There is diffuse pulmonary vascular congestion with indistinctness of the interstitial markings, suggesting moderate diffuse pulmonary edema. No definite focal infiltrates. No pneumothorax.  No acute osseus abnormality. Surgical clips overlie the right axilla.  IMPRESSION: Cardiomegaly with moderate diffuse pulmonary edema and bilateral pleural effusions, left greater than right.   Electronically Signed   By: Rise Mu M.D.   On: 08/16/2014 06:53    My personal review of EKG:  Atrial fibrillation, right bundle branch block, prolonged QT   Assessment & Plan  Principal Problem:   Sepsis Active Problems:   CHF exacerbation   Hip fracture   HCAP (healthcare-associated pneumonia)   Atrial fibrillation   Chronic anticoagulation   Acute respiratory failure  Sepsis with acute respiratory failure Secondary to age Versus aspiration pneumonia in  combination with acute on chronic congestive heart failure. Lactic acid is elevated at 3.4. Respiratory rate is elevated, patient is in sinus tach. As she has bilateral pleural effusions and evidence of pulmonary edema we will not give aggressive IV fluids at this time. Rather we will give Lasix. She was treated with BiPAP in the ER and has now been able to progress to nasal cannula. Blood cultures are pending.  Aspiration pneumonia versus healthcare associated pneumonia with acute respiratory failure. Patient had a recent hip fracture and was discharged on June 23.  Pulmonary embolism was considered but felt to be less likely as her INR is 3. Blood cultures been obtained, sputum culture is pending, urine for Legionella antigen and strep pneumo are pending. Will start IV vancomycin and Zosyn per pharmacy.  Patient will receive Solu-Medrol, Xopenex and Atrovent nebulizers, Mucinex.  Cardiomyopathy with acute congestive heart failure   IV Lasix ordered. 2-D echo ordered. Daily weights. She will be nothing by mouth and receive PEG feedings.   cardiology has been consulted.    atrial fibrillation Currently rate controlled. We'll continue diltiazem, and warfarin per pharmacy.  Right knee pain. Her sister, Tamara Bailey, patient recently had a fall at [facility]. Will xray knee.  No swelling or tenderness noted.  Dementia Patient is alert to person and place.  She is able to appropriately answer questions but is difficult to understand.  Per sister the patient's dementia has become much worse recently with the changes in Nursing home and hospitalizations.  Hypothyroidism Continue Synthroid  History of dysphagia PEG tube in place. Will receive PEG feedings. Nothing by mouth for now. Speech consulted.   Consultants Called:   cardiology   Family Communication:    sister Tamara Bailey at [phone] who asks to be updated with any changes. She currently has a respiratory infection and therefore will not come  to the hospital until she is better. During our discussion we talked about the patient having a prognosis of months or possibly less.  Code Status:  DO NOT RESUSCITATE   Condition:   guarded   Potential Disposition:  To SNF in estimated 72+ hours.  Time spent in minutes : [address]  PA-C on 09-01-2014 at 9:38 AM Between 7am to 7pm - Pager - [phone] After 7pm go to [email or web address] - password [email or web address] And look for the night coverage person covering me after hours  [facility]

## 2014-08-22 NOTE — ED Notes (Signed)
[facility] at bedside with patient.    Patient suctioned and a thick, yellowish/white sputum was removed from the back of the throat.

## 2014-08-22 NOTE — Progress Notes (Signed)
CSW confirmed that [facility] and pt is welcome to return when ready for DC.  CSW will continue to follow.  Merlyn LotJenna Holoman, LCSWA Clinical Social Worker [phone]

## 2014-08-23 ENCOUNTER — Other Ambulatory Visit (HOSPITAL_COMMUNITY): Payer: Medicare Other

## 2014-08-23 DIAGNOSIS — E876 Hypokalemia: Secondary | ICD-10-CM | POA: Diagnosis present

## 2014-08-23 LAB — CBC
HCT: 39.2 % (ref 36.0–46.0)
Hemoglobin: 13 g/dL (ref 12.0–15.0)
MCH: 30 pg (ref 26.0–34.0)
MCHC: 33.2 g/dL (ref 30.0–36.0)
MCV: 90.3 fL (ref 78.0–100.0)
PLATELETS: 208 10*3/uL (ref 150–400)
RBC: 4.34 MIL/uL (ref 3.87–5.11)
RDW: 15.9 % — ABNORMAL HIGH (ref 11.5–15.5)
WBC: 4.8 10*3/uL (ref 4.0–10.5)

## 2014-08-23 LAB — PROCALCITONIN: Procalcitonin: 0.14 ng/mL

## 2014-08-23 LAB — COMPREHENSIVE METABOLIC PANEL
ALT: 61 U/L — ABNORMAL HIGH (ref 14–54)
ANION GAP: 17 — AB (ref 5–15)
AST: 96 U/L — ABNORMAL HIGH (ref 15–41)
Albumin: 3.4 g/dL — ABNORMAL LOW (ref 3.5–5.0)
Alkaline Phosphatase: 172 U/L — ABNORMAL HIGH (ref 38–126)
BUN: 32 mg/dL — AB (ref 6–20)
CALCIUM: 9.1 mg/dL (ref 8.9–10.3)
CO2: 26 mmol/L (ref 22–32)
Chloride: 90 mmol/L — ABNORMAL LOW (ref 101–111)
Creatinine, Ser: 0.91 mg/dL (ref 0.44–1.00)
GFR calc Af Amer: >60 mL/min (ref >60)
GFR calc non Af Amer: 57 mL/min — ABNORMAL LOW (ref >60)
Glucose, Bld: 206 mg/dL — ABNORMAL HIGH (ref 65–99)
POTASSIUM: 3.2 mmol/L — AB (ref 3.5–5.1)
SODIUM: 133 mmol/L — AB (ref 135–145)
Total Bilirubin: 1.3 mg/dL — ABNORMAL HIGH (ref 0.3–1.2)
Total Protein: 6.5 g/dL (ref 6.5–8.1)

## 2014-08-23 LAB — GLUCOSE, CAPILLARY
GLUCOSE-CAPILLARY: 126 mg/dL — AB (ref 65–99)
GLUCOSE-CAPILLARY: 168 mg/dL — AB (ref 65–99)
GLUCOSE-CAPILLARY: 231 mg/dL — AB (ref 65–99)
Glucose-Capillary: 215 mg/dL — ABNORMAL HIGH (ref 65–99)
Glucose-Capillary: 195 mg/dL — ABNORMAL HIGH (ref 65–99)
Glucose-Capillary: 189 mg/dL — ABNORMAL HIGH (ref 65–99)

## 2014-08-23 LAB — STREP PNEUMONIAE URINARY ANTIGEN: STREP PNEUMO URINARY ANTIGEN: NEGATIVE

## 2014-08-23 LAB — PROTIME-INR
INR: 2.82 — ABNORMAL HIGH (ref 0.00–1.49)
PROTHROMBIN TIME: 29.2 s — AB (ref 11.6–15.2)

## 2014-08-23 LAB — TROPONIN I: Troponin I: 0.03 ng/mL (ref <0.031)

## 2014-08-23 MED ORDER — AMIODARONE HCL 200 MG PO TABS
400.0000 mg | ORAL_TABLET | Freq: Two times a day (BID) | ORAL | Status: DC
  Administered 2014-08-23 – 2014-08-25 (×4): 400 mg
  Filled 2014-08-23 (×5): qty 2

## 2014-08-23 MED ORDER — WARFARIN SODIUM 4 MG PO TABS
4.0000 mg | ORAL_TABLET | Freq: Once | ORAL | Status: AC
  Administered 2014-08-23: 4 mg via ORAL
  Filled 2014-08-23: qty 1

## 2014-08-23 MED ORDER — AMIODARONE HCL 200 MG PO TABS
400.0000 mg | ORAL_TABLET | Freq: Two times a day (BID) | ORAL | Status: DC
  Administered 2014-08-23: 400 mg via ORAL
  Filled 2014-08-23 (×2): qty 2

## 2014-08-23 MED ORDER — POTASSIUM CHLORIDE 20 MEQ/15ML (10%) PO SOLN
40.0000 meq | Freq: Two times a day (BID) | ORAL | Status: DC
  Administered 2014-08-23 – 2014-08-28 (×11): 40 meq
  Filled 2014-08-23 (×13): qty 30

## 2014-08-23 MED ORDER — BETHANECHOL CHLORIDE 10 MG PO TABS
10.0000 mg | ORAL_TABLET | Freq: Three times a day (TID) | ORAL | Status: DC
  Administered 2014-08-23 – 2014-08-28 (×18): 10 mg
  Filled 2014-08-23 (×21): qty 1

## 2014-08-23 MED ORDER — LEVALBUTEROL HCL 0.63 MG/3ML IN NEBU
0.6300 mg | INHALATION_SOLUTION | Freq: Four times a day (QID) | RESPIRATORY_TRACT | Status: DC
  Administered 2014-08-23 – 2014-08-25 (×8): 0.63 mg via RESPIRATORY_TRACT
  Filled 2014-08-23 (×20): qty 3

## 2014-08-23 MED ORDER — IPRATROPIUM BROMIDE 0.02 % IN SOLN
0.5000 mg | Freq: Four times a day (QID) | RESPIRATORY_TRACT | Status: DC
  Administered 2014-08-23 – 2014-08-25 (×8): 0.5 mg via RESPIRATORY_TRACT
  Filled 2014-08-23 (×11): qty 2.5

## 2014-08-23 NOTE — Progress Notes (Signed)
Patient Profile: Tamara Bailey is an 81 y.o. year old female with a PMH of chronic Afib, CHA2DS2-VASc score=5, CVA 2006, HTN, breast CA s/p chemo and, PEG tube for malnutrition/dysphagia, dementia, fall 06/19 w/ hip repair (D/C 06/23), presenting from [facility] with shortness of breath.   Subjective: Feeling much better today. She is laying flat in bed without objective dyspnea, but says she still feels a little short of breath. She is alert and oriented and is eager to get back to the [facility] home SNF.   Objective: Vital signs in last 24 hours: Temp:  [97.2 F (36.2 C)-98.8 F (37.1 C)] 97.6 F (36.4 C) (06/29 1207) Pulse Rate:  [113-139] 118 (06/29 1207) Resp:  [19-27] 19 (06/29 1207) BP: (120-147)/(70-105) 147/78 mmHg (06/29 1207) SpO2:  [91 %-100 %] 96 % (06/29 1153) Weight:  [55.3 kg (121 lb 14.6 oz)] 55.3 kg (121 lb 14.6 oz) (06/29 0444) Last BM Date: 08/21/14  Intake/Output from previous day: 06/28 0701 - 06/29 0700 In: 2357.5 [I.V.:803.5; NG/GT:474; IV Piggyback:400] Out: 95 [Urine:95] Intake/Output this shift: Total I/O In: 183.5 [I.V.:83.5; IV Piggyback:100] Out: 330 [Urine:330]  Medications Current Facility-Administered Medications  Medication Dose Route Frequency Provider Last Rate Last Dose  . 0.9 %  sodium chloride infusion  250 mL Intravenous PRN Stephani Police, PA-C      . acetaminophen (TYLENOL) tablet 650 mg  650 mg Per Tube Q6H PRN Stephani Police, PA-C       Or  . acetaminophen (TYLENOL) suppository 650 mg  650 mg Rectal Q6H PRN Stephani Police, PA-C      . amiodarone (NEXTERONE PREMIX) 360 MG/200ML (1.8 mg/mL) IV infusion  30 mg/hr Intravenous Continuous Dolores Patty, MD 16.7 mL/hr at 08/23/14 0754 30 mg/hr at 08/23/14 0754  . bethanechol (URECHOLINE) tablet 10 mg  10 mg Per Tube TID Lonia Blood, MD   10 mg at 08/23/14 1006  . budesonide (PULMICORT) nebulizer solution 0.5 mg  0.5 mg Nebulization BID Stephani Police, PA-C   0.5  mg at 08/23/14 0902  . feeding supplement (OSMOLITE 1.5 CAL) liquid 237 mL  237 mL Per Tube 5 X Daily Stephani Police, PA-C   237 mL at 08/23/14 1006  . ferrous sulfate 300 (60 FE) MG/5ML syrup 300 mg  300 mg Per Tube QAC lunch Ripudeep K Rai, MD   300 mg at 08/23/14 1134  . furosemide (LASIX) injection 40 mg  40 mg Intravenous BID Stephani Police, PA-C   40 mg at 08/23/14 0755  . guaifenesin (ROBITUSSIN) 100 MG/5ML syrup 400 mg  400 mg Per Tube Q8H Marianne L York, PA-C   400 mg at 08/23/14 0612  . HYDROcodone-acetaminophen (NORCO/VICODIN) 5-325 MG per tablet 1 tablet  1 tablet Per Tube Q6H PRN Stephani Police, PA-C      . ipratropium (ATROVENT) nebulizer solution 0.5 mg  0.5 mg Nebulization Q4H Marianne L York, PA-C   0.5 mg at 08/23/14 1152  . levalbuterol (XOPENEX) nebulizer solution 0.63 mg  0.63 mg Nebulization Q4H PRN Ripudeep Jenna Luo, MD   0.63 mg at 08/23/14 0729  . levalbuterol (XOPENEX) nebulizer solution 1.25 mg  1.25 mg Nebulization Q4H Stephani Police, PA-C   1.25 mg at 08/23/14 0902  . levothyroxine (SYNTHROID, LEVOTHROID) tablet 50 mcg  50 mcg Per Tube QAC breakfast Ripudeep Jenna Luo, MD   50 mcg at 08/23/14 0612  . methylPREDNISolone sodium succinate (SOLU-MEDROL) 125 mg/2 mL injection 60  mg  60 mg Intravenous Q6H Marianne L York, PA-C   60 mg at 08/23/14 1005  . ondansetron (ZOFRAN) tablet 4 mg  4 mg Oral Q6H PRN Stephani Police, PA-C       Or  . ondansetron [facility]) injection 4 mg  4 mg Intravenous Q6H PRN Stephani Police, PA-C      . piperacillin-tazobactam (ZOSYN) IVPB 3.375 g  3.375 g Intravenous 3 times per day Armandina Stammer, RPH   3.375 g at 08/23/14 0612  . potassium chloride 20 MEQ/15ML (10%) solution 20 mEq  20 mEq Per Tube BID Ripudeep Jenna Luo, MD   20 mEq at 08/23/14 1006  . sodium chloride 0.9 % injection 3 mL  3 mL Intravenous Q12H Marianne L York, PA-C   3 mL at 08/23/14 1004  . sodium chloride 0.9 % injection 3 mL  3 mL Intravenous PRN Stephani Police, PA-C      .  traMADol (ULTRAM) tablet 50 mg  50 mg Oral Q6H PRN Stephani Police, PA-C   50 mg at 08/11/2014 2139  . vancomycin (VANCOCIN) 500 mg in sodium chloride 0.9 % 100 mL IVPB  500 mg Intravenous Q24H Para March Batchelder, RPH   500 mg at 08/23/14 1002  . warfarin (COUMADIN) tablet 4 mg  4 mg Oral ONCE-1800 Rachel L Rumbarger, RPH      . Warfarin - Pharmacist Dosing Inpatient   Does not apply q1800 Rosaland Lao, RPH        PE: General appearance: alert, cooperative, appears stated age and no distress Neck: JVD - 7 cm above sternal notch, no adenopathy, no carotid bruit, supple, symmetrical, trachea midline and thyroid not enlarged, symmetric, no tenderness/mass/nodules Lungs: diminished breath sounds LLL and LUL and rales LLL and RLL Heart: regularly irregular rhythm, S1, S2 normal and no S3 or S4 Abdomen: soft, non-tender; bowel sounds normal; no masses,  no organomegaly Extremities: edema trace pedal edema bilaterally.  Pulses: 2+ and symmetric Skin: Skin color, texture, turgor normal. No rashes or lesions Neurologic: Motor: 2/5 strength in LLE and LUE arm(s) left, elbow(s) left, wrist(s) left, upper leg(s) (chronic finding), lower leg(s) left  (chronic finding)  Lab Results:   Recent Labs  08/24/2014 0600 08/23/14 0030  WBC 6.0 4.8  HGB 12.6 13.0  HCT 39.5 39.2  PLT 206 208   BMET  Recent Labs  08/20/2014 0930 08/23/14 0030  NA 132* 133*  K 4.4 3.2*  CL 93* 90*  CO2 25 26  GLUCOSE 135* 206*  BUN 35* 32*  CREATININE 0.65 0.91  CALCIUM 9.6 9.1   PT/INR  Recent Labs  08/16/2014 0600 08/23/14 0030  LABPROT 30.7* 29.2*  INR 3.01* 2.82*   Studies/Results: 07/29/2014 CXR: Cardiomegaly with moderate diffuse pulmonary edema and bilateral pleural effusions, left greater than right.   Assessment/Plan Principal Problem:   Sepsis Active Problems:   Hip fracture   CHF exacerbation   HCAP (healthcare-associated pneumonia)   Atrial fibrillation with rapid ventricular response    Chronic anticoagulation   Acute respiratory failure   Hypokalemia   1. Acute Systolic vs. Diastolic CHF: - Dyspnea improving. Still significantly hypervolemic on exam.  - BNP 1058.7. Unknown baseline weight. Current wt 55.3 kg.  - I&O net negative -146 mL today. Cumulative I&O inaccurate due to incontinence. Now with Foley.  - Echo pending.    - Continue Lasix 40 mg IV BID. Not Cr increasing, monitor I/O after foley placement  2. Sepsis due to  HCAP/acute respiratory failure: - Improving with Abx, steroids, and nebs.  - BP stable.  - Lactic acid peaked at 3.4 yesterday.  - Blood cx pending - Management per primary team  3. Hypokalemia: - K+ 3.2.  - Primary team repleted with IV KCl  4. Chronic AF: - Remains in AF, rates 120s-130s.  - IV amiodarone load will finish at 1430 today. Transition to amiodarone 400 mg PO BID this evening.  - CHA2DS2-VASc 5. On chronic anticoagulation.  - INR therapeutic since admission.  - RVR likely related to acute illness. Will likely improve with abx and diuresis.  - can potentially add BB if does not convert.  5. Recent L hip fracture  6. Hx CVA  7. Dysphagia with tube feeds via PEG   LOS: 1 day   Franne Fortsyler Crane, NP student   08/23/2014 1:17 PM  Patient seen with Joselyn Glassmanyler NP student, treatment plan discussed and physical exam performed separately Signed, Azalee CourseHao Meng PA Pager: [phone]  Personally seen and examined. Agree with above. Thin, cachexia, facial droop, lung crackles. Continue diuresis.  Amiodarone for rate control Reviewed Dr. Leonard SchwartzB note.   Donato SchultzSKAINS, MARK, MD

## 2014-08-23 NOTE — Progress Notes (Signed)
Inpatient Diabetes Program Recommendations  AACE/ADA: New Consensus Statement on Inpatient Glycemic Control (2013)  Target Ranges:  Prepandial:   less than 140 mg/dL      Peak postprandial:   less than 180 mg/dL (1-2 hours)      Critically ill patients:  140 - 180 mg/dL   Results for Tamara Bailey, Tamara Bailey (MRN [id number]) as of 08/23/2014 08:30  Ref. Range 08/02/2014 17:23 07/28/2014 20:45 08/08/2014 23:38 08/23/2014 03:58  Glucose-Capillary Latest Ref Range: 65-99 mg/dL 409163 (H) 811249 (H) 914231 (H) 168 (H)   Diabetes history: No Outpatient Diabetes medications: NA Current orders for Inpatient glycemic control: CBGs Q4H  Inpatient Diabetes Program Recommendations Correction (SSI): While inpatient and ordered steroids, please consider ordering CBGs with Novolog correction scale Q4H. HgbA1C: Please consider ordering an A1C to evaluate glycemic control over the past 2-3 months.  Thanks, Orlando PennerMarie Inocencia Murtaugh, RN, MSN, CCRN, CDE Diabetes Coordinator Inpatient Diabetes Program [phone] (Team Pager from 8am to 5pm) [phone] ([facility] office) [phone] [facility] office) [phone] [facility] office)

## 2014-08-23 NOTE — Progress Notes (Signed)
[facility] TEAM 1 - Stepdown/ICU TEAM Progress Note  Arlana PouchMary Ellen Kabir ZOX:[id number] DOB: 04/18/1929 DOA: 08/02/2014 PCP: Ginette OttoSTONEKING,HAL THOMAS, MD  Admit HPI / Brief Narrative: 81 y.o. F SNF resident with hx afib on coumadin, dementia, and stroke who presented with the sudden onset of shortness of breath.  Thick white secretions [facility] prior to coming to the ER.  She has a PEG tube in place for dysphagia, and has been received PEG tube feedings as well as oral feedings. Lactic acid was 3.48, CBC noted elevated neutrophils. Chest x-ray suggested pulmonary edema with bilateral pleural effusions.   Per her sister Bayard Beaverva Moody [phone]) she had been having difficulty with thick white secretions for a long period of time. She had been becoming progressively weaker over several months.  HPI/Subjective: The patient appears to be resting comfortably in bed.  She has no specific complaints at this time but seems to be somewhat confused and therefore her history is not felt to be reliable.  Assessment/Plan:   Acute respiratory failure  respirations appear to be much more comfortable at this time - wean oxygen as able  Sepsis due to Aspiration pneumonitis versus HCAP The patient is not febrile nor does she have an elevated white blood cell count - this lowers my suspicion of an active pulmonary infection - she certainly appears to be at high risk for aspiration pneumonitis - we'll continue antibiotics for a short 3 day course for now - trend pro-calcitonin  Acute CHF?  TTE pending - physical exam not overly convincing of volume overload - follow I's/O's  Chronic atrial fibrillation CHad2Vasc2 score 5 - care as per cardiology   Right knee pain. Denies pain today - follow clinically for now   Dementia Patient is alert to person and place. She is able to appropriately answer questions but is difficult to understand. Per sister the patient's  dementia has become much worse recently with the changes in Nursing home and hospitalizations.  Hypothyroidism Continue Synthroid  History of dysphagia PEG tube in place.  Utilize for feedings. Nothing by mouth for now. Speech consulted.  Code Status: NO CODE - DNR  Family Communication: no family present at time of exam Disposition Plan: SDU  Consultants: Cardiology  Procedures: None  Antibiotics: Zosyn 6/28 > Vancomycin 6/28 >  DVT prophylaxis: Warfarin   Objective: Blood pressure 147/78, pulse 118, temperature 97.6 F (36.4 C), temperature source Oral, resp. rate 19, height 5\' 2"  (1.575 m), weight 55.3 kg (121 lb 14.6 oz), SpO2 96 %.  Intake/Output Summary (Last 24 hours) at 08/23/14 1530 Last data filed at 08/23/14 1500  Gross per 24 hour  Intake 2285.14 ml  Output    600 ml  Net 1685.14 ml   Exam: General: No acute respiratory distress laying essentially flat and bed  Lungs: Clear to auscultation bilaterally without wheezes or crackles - coarse upper airway sounds transmitted throughout all fields  Cardiovascular: Regular rate without murmur gallop or rub normal S1 and S2 Abdomen: Nontender, nondistended, soft, bowel sounds positive, no rebound, no ascites, no appreciable mass Extremities: No significant cyanosis, clubbing, or edema bilateral lower extremities  Data Reviewed: Basic Metabolic Panel:  Recent Labs Lab 08/20/2014 0930 08/23/14 0030  NA 132* 133*  K 4.4 3.2*  CL 93* 90*  CO2 25 26  GLUCOSE 135* 206*  BUN 35* 32*  CREATININE 0.65 0.91  CALCIUM 9.6 9.1    CBC:  Recent Labs Lab 08/09/2014 0600  08/23/14 0030  WBC 6.0 4.8  NEUTROABS 5.2  --   HGB 12.6 13.0  HCT 39.5 39.2  MCV 91.0 90.3  PLT 206 208    Liver Function Tests:  Recent Labs Lab 08/23/14 0030  AST 96*  ALT 61*  ALKPHOS 172*  BILITOT 1.3*  PROT 6.5  ALBUMIN 3.4*    Coags:  Recent Labs Lab 08/24/2014 0600 08/23/14 0030  INR 3.01* 2.82*   Cardiac  Enzymes:  Recent Labs Lab 08/23/2014 1255 08/07/2014 1823 08/23/14 0030  TROPONINI 0.04* 0.04* 0.03    CBG:  Recent Labs Lab 08/18/2014 2045 08/21/2014 2338 08/23/14 0358 08/23/14 0813 08/23/14 1209  GLUCAP 249* 231* 168* 215* 195*    Recent Results (from the past 240 hour(s))  Blood culture (routine x 2)     Status: None (Preliminary result)   Collection Time: 08/01/2014  6:00 AM  Result Value Ref Range Status   Specimen Description BLOOD RIGHT ANTECUBITAL  Final   Special Requests BOTTLES DRAWN AEROBIC AND ANAEROBIC  Final   Culture NO GROWTH 1 DAY  Final   Report Status PENDING  Incomplete  Blood culture (routine x 2)     Status: None (Preliminary result)   Collection Time: 08/09/2014  9:20 AM  Result Value Ref Range Status   Specimen Description BLOOD BLOOD RIGHT FOREARM  Final   Special Requests BOTTLES DRAWN AEROBIC AND ANAEROBIC  Final   Culture NO GROWTH 1 DAY  Final   Report Status PENDING  Incomplete  MRSA PCR Screening     Status: None   Collection Time: 07/30/2014 12:30 PM  Result Value Ref Range Status   MRSA by PCR NEGATIVE NEGATIVE Final    Comment:        The GeneXpert MRSA Assay (FDA approved for NASAL specimens only), is one component of a comprehensive MRSA colonization surveillance program. It is not intended to diagnose MRSA infection nor to guide or monitor treatment for MRSA infections.      Studies:   Recent x-ray studies have been reviewed in detail by the Attending Physician  Scheduled Meds:  Scheduled Meds: . amiodarone  400 mg Oral BID  . bethanechol  10 mg Per Tube TID  . budesonide (PULMICORT) nebulizer solution  0.5 mg Nebulization BID  . feeding supplement (OSMOLITE 1.5 CAL)  237 mL Per Tube 5 X Daily  . ferrous sulfate  300 mg Per Tube QAC lunch  . guaifenesin  400 mg Per Tube Q8H  . ipratropium  0.5 mg Nebulization Q4H  . levalbuterol  1.25 mg Nebulization Q4H  . levothyroxine  50 mcg Per Tube QAC breakfast  .  methylPREDNISolone (SOLU-MEDROL) injection  60 mg Intravenous Q6H  . piperacillin-tazobactam (ZOSYN)  IV  3.375 g Intravenous 3 times per day  . potassium chloride  20 mEq Per Tube BID  . sodium chloride  3 mL Intravenous Q12H  . vancomycin  500 mg Intravenous Q24H  . warfarin  4 mg Oral ONCE-1800  . Warfarin - Pharmacist Dosing Inpatient   Does not apply q1800    Time spent on care of this patient: 35 mins   [facility] T , MD   [facility] Office  [phone] Pager - Text Page per Amion as per below:  On-Call/Text Page:      [email or web address]      password [email or web address]  If 7PM-7AM, please contact night-coverage [email or web address] Password [email or web address] 08/23/2014, 3:30 PM   LOS: 1 day

## 2014-08-23 NOTE — Progress Notes (Signed)
Speech Language Pathology Treatment: Dysphagia  Patient Details Name: Arlana PouchMary Ellen Higuchi MRN: [id number] DOB: 10/12/1929 Today's Date: 08/23/2014 Time: [id number] SLP Time Calculation (min) (ACUTE ONLY): 26 min  Assessment / Plan / Recommendation Clinical Impression  Pt seen for dysphagia treatment. Significant xerostomia present and oral care provided resulting in copious amounts of very thick secretions retrieved with pt cough and assist to expel with suction. Dried mucous (mild) on hard palate. Ice chips provided to assist is loosening secretions with delayed cough likely penetrating/aspirating. Pt states she eats " a little of 3 meals a day" with tube feedings. SLP educated pt re: aspiration risks that are high given muscle weakness from decreased use and amount of pharyngeal secretions an paralyzed vocal quality. Discussed FEES benefits of performing, possible outcomes and pt decided she did not wish to have objective swallow assessment. RN updated re: results of oral care and continuation of intense oral hygiene. SLP to sign off.    HPI Other Pertinent Information: 81 y.o. female, with A-fib, stroke, hip fx 2015, breast cancer admitted with shortness of breath. Found to have sepsis with acute respiratory failure secondary to age versus aspiration pneumonia in combination with acute on chronic congestive heart failure. MBS 02/2013 revealing significant residue throughout the pharynx markedly reduced CP relaxation, resulting in a minimal amount of bolus passing through to the esophagus and penetration and aspiration residue (bringing majority of bolus back up to the oral cavity. Chest x-ray shows pulmonary edema with bilateral pleural effusions. Pt states she eats "some" but frequency and amount was unclear.    Pertinent Vitals Pain Assessment: No/denies pain  SLP Plan  Discharge SLP treatment due to (comment) (no further services warranted)    Recommendations Diet recommendations: NPO Medication  Administration:  (has PEG)              Oral Care Recommendations: Oral care BID Plan: Discharge SLP treatment due to (comment) (no further services warranted)    GO     Royce MacadamiaLitaker, Kamaury Cutbirth Willis 08/23/2014, 3:11 PM  Breck CoonsLisa Willis Lonell FaceLitaker M.Ed [employer]-SLP Pager [phone]

## 2014-08-23 NOTE — Clinical Social Work Note (Signed)
Clinical Social Work Assessment  Patient Details  Name: Tamara Bailey MRN: [id number] Date of Birth: 10/01/1929  Date of referral:  08/23/14               Reason for consult:  Facility Placement                Permission sought to share information with:  Facility Industrial/product designerContact Representative Permission granted to share information::  No  Name::        Agency::  [name]  Relationship::     Contact Information:     Housing/Transportation Living arrangements for the past 2 months:  Skilled Building surveyorursing Facility Source of Information:  Facility Patient Interpreter Needed:  None Criminal Activity/Legal Involvement Pertinent to Current Situation/Hospitalization:  No - Comment as needed Significant Relationships:  None Lives with:  Facility Resident Do you feel safe going back to the place where you live?  Yes Need for family participation in patient care:  No (Coment) (no family on record)  Care giving concerns: none- pt [name]   Social Worker assessment / plan:  CSW discussed [name]  Employment status:  Retired Health and safety inspectornsurance information:    PT Recommendations:  Skilled Nursing Facility Information / Referral to community resources:  Skilled Nursing Facility  Patient/Family's Response to care:  Patient is eager to go back [name] when medically stable  Patient/Family's Understanding of and Emotional Response to Diagnosis, Current Treatment, and Prognosis:  Pt expressed no questions or concerns about current treatment plan but is anxious to return to facility  Emotional Assessment Appearance:  Appears stated age Attitude/Demeanor/Rapport:    Affect (typically observed):    Orientation:    Alcohol / Substance use:    Psych involvement (Current and /or in the community):  No (Comment)  Discharge Needs  Concerns to be addressed:  Care Coordination Readmission within the last 30 days:  No Current discharge risk:  Physical  Impairment Barriers to Discharge:  Continued Medical Work up   Peabody EnergyHoloman, Zariah Jost M, LCSW 08/23/2014, 3:03 PM

## 2014-08-23 NOTE — Progress Notes (Signed)
ANTICOAGULATION CONSULT NOTE - Follow Up Consult  Pharmacy Consult for warfarin Indication: atrial fibrillation  Allergies  Allergen Reactions  . Morphine And Related Nausea And Vomiting    Patient Measurements: Height: 5\' 2"  (157.5 cm) Weight: 121 lb 14.6 oz (55.3 kg) IBW/kg (Calculated) : 50.1  Vital Signs: Temp: 98.2 F (36.8 C) (06/29 0813) Temp Source: Oral (06/29 0813) BP: 123/84 mmHg (06/29 0813) Pulse Rate: 139 (06/29 0813)  Labs:  Recent Labs  08/24/2014 0600 08/01/2014 0930 08/10/2014 1255 08/06/2014 1823 08/23/14 0030  HGB 12.6  --   --   --  13.0  HCT 39.5  --   --   --  39.2  PLT 206  --   --   --  208  LABPROT 30.7*  --   --   --  29.2*  INR 3.01*  --   --   --  2.82*  CREATININE  --  0.65  --   --  0.91  TROPONINI  --   --  0.04* 0.04* 0.03    Estimated Creatinine Clearance: 37 mL/min (by C-G formula based on Cr of 0.91).  Assessment: [age] yof continues on chronic coumadin for afib. INR is therapeutic at 2.82. CBC is WNL and no bleeding noted. Of note, pt was started on amiodarone. This interacts with warfarin and may cause and increased INR. However, interaction may be delayed. Will require close monitoring. If pt to be discharged on amiodarone, her home regimen will likely need adjusting.   Goal of Therapy:  INR 2-3 Monitor platelets by anticoagulation protocol: Yes   Plan:  - Warfarin 4mg  PO x 1 tonight - Daily INR  Aina Rossbach, Drake Leachachel Lynn 08/23/2014,9:50 AM

## 2014-08-24 ENCOUNTER — Ambulatory Visit (HOSPITAL_COMMUNITY): Payer: Medicare Other

## 2014-08-24 DIAGNOSIS — A419 Sepsis, unspecified organism: Secondary | ICD-10-CM | POA: Diagnosis present

## 2014-08-24 DIAGNOSIS — F0390 Unspecified dementia without behavioral disturbance: Secondary | ICD-10-CM

## 2014-08-24 DIAGNOSIS — M25561 Pain in right knee: Secondary | ICD-10-CM

## 2014-08-24 DIAGNOSIS — J9601 Acute respiratory failure with hypoxia: Secondary | ICD-10-CM | POA: Diagnosis present

## 2014-08-24 DIAGNOSIS — R0600 Dyspnea, unspecified: Secondary | ICD-10-CM

## 2014-08-24 DIAGNOSIS — I482 Chronic atrial fibrillation, unspecified: Secondary | ICD-10-CM | POA: Diagnosis present

## 2014-08-24 DIAGNOSIS — I5021 Acute systolic (congestive) heart failure: Secondary | ICD-10-CM

## 2014-08-24 DIAGNOSIS — E038 Other specified hypothyroidism: Secondary | ICD-10-CM | POA: Diagnosis present

## 2014-08-24 DIAGNOSIS — R1310 Dysphagia, unspecified: Secondary | ICD-10-CM | POA: Diagnosis present

## 2014-08-24 DIAGNOSIS — J189 Pneumonia, unspecified organism: Secondary | ICD-10-CM

## 2014-08-24 LAB — CBC WITH DIFFERENTIAL/PLATELET
BASOS PCT: 0 % (ref 0–1)
Basophils Absolute: 0 10*3/uL (ref 0.0–0.1)
Eosinophils Absolute: 0 10*3/uL (ref 0.0–0.7)
Eosinophils Relative: 0 % (ref 0–5)
HEMATOCRIT: 39.1 % (ref 36.0–46.0)
HEMOGLOBIN: 12.6 g/dL (ref 12.0–15.0)
Lymphocytes Relative: 3 % — ABNORMAL LOW (ref 12–46)
Lymphs Abs: 0.2 10*3/uL — ABNORMAL LOW (ref 0.7–4.0)
MCH: 29.4 pg (ref 26.0–34.0)
MCHC: 32.2 g/dL (ref 30.0–36.0)
MCV: 91.4 fL (ref 78.0–100.0)
Monocytes Absolute: 0.8 10*3/uL (ref 0.1–1.0)
Monocytes Relative: 11 % (ref 3–12)
Neutro Abs: 6 10*3/uL (ref 1.7–7.7)
Neutrophils Relative %: 86 % — ABNORMAL HIGH (ref 43–77)
Platelets: 214 10*3/uL (ref 150–400)
RBC: 4.28 MIL/uL (ref 3.87–5.11)
RDW: 16.4 % — AB (ref 11.5–15.5)
WBC: 7 10*3/uL (ref 4.0–10.5)

## 2014-08-24 LAB — CBC
HCT: 41.3 % (ref 36.0–46.0)
Hemoglobin: 13.3 g/dL (ref 12.0–15.0)
MCH: 29.2 pg (ref 26.0–34.0)
MCHC: 32.2 g/dL (ref 30.0–36.0)
MCV: 90.8 fL (ref 78.0–100.0)
PLATELETS: 217 10*3/uL (ref 150–400)
RBC: 4.55 MIL/uL (ref 3.87–5.11)
RDW: 16.3 % — AB (ref 11.5–15.5)
WBC: 7.5 10*3/uL (ref 4.0–10.5)

## 2014-08-24 LAB — LEGIONELLA ANTIGEN, URINE

## 2014-08-24 LAB — COMPREHENSIVE METABOLIC PANEL
ALBUMIN: 3.3 g/dL — AB (ref 3.5–5.0)
ALBUMIN: 3.2 g/dL — AB (ref 3.5–5.0)
ALK PHOS: 160 U/L — AB (ref 38–126)
ALK PHOS: 149 U/L — AB (ref 38–126)
ALT: 52 U/L (ref 14–54)
ALT: 47 U/L (ref 14–54)
ANION GAP: 12 (ref 5–15)
AST: 58 U/L — AB (ref 15–41)
AST: 49 U/L — AB (ref 15–41)
Anion gap: 13 (ref 5–15)
BILIRUBIN TOTAL: 1.3 mg/dL — AB (ref 0.3–1.2)
BILIRUBIN TOTAL: 1.1 mg/dL (ref 0.3–1.2)
BUN: 35 mg/dL — AB (ref 6–20)
BUN: 36 mg/dL — ABNORMAL HIGH (ref 6–20)
CALCIUM: 9.1 mg/dL (ref 8.9–10.3)
CALCIUM: 8.8 mg/dL — AB (ref 8.9–10.3)
CO2: 34 mmol/L — ABNORMAL HIGH (ref 22–32)
CO2: 32 mmol/L (ref 22–32)
Chloride: 89 mmol/L — ABNORMAL LOW (ref 101–111)
Chloride: 91 mmol/L — ABNORMAL LOW (ref 101–111)
Creatinine, Ser: 0.78 mg/dL (ref 0.44–1.00)
Creatinine, Ser: 0.81 mg/dL (ref 0.44–1.00)
GFR calc Af Amer: >60 mL/min (ref >60)
GFR calc non Af Amer: >60 mL/min (ref >60)
GFR calc non Af Amer: >60 mL/min (ref >60)
GLUCOSE: 123 mg/dL — AB (ref 65–99)
GLUCOSE: 177 mg/dL — AB (ref 65–99)
Potassium: 3.4 mmol/L — ABNORMAL LOW (ref 3.5–5.1)
Potassium: 3.7 mmol/L (ref 3.5–5.1)
Sodium: 135 mmol/L (ref 135–145)
Sodium: 136 mmol/L (ref 135–145)
TOTAL PROTEIN: 6.7 g/dL (ref 6.5–8.1)
TOTAL PROTEIN: 6.4 g/dL — AB (ref 6.5–8.1)

## 2014-08-24 LAB — GLUCOSE, CAPILLARY
GLUCOSE-CAPILLARY: 160 mg/dL — AB (ref 65–99)
Glucose-Capillary: 138 mg/dL — ABNORMAL HIGH (ref 65–99)
Glucose-Capillary: 149 mg/dL — ABNORMAL HIGH (ref 65–99)
Glucose-Capillary: 183 mg/dL — ABNORMAL HIGH (ref 65–99)
Glucose-Capillary: 162 mg/dL — ABNORMAL HIGH (ref 65–99)
Glucose-Capillary: 167 mg/dL — ABNORMAL HIGH (ref 65–99)

## 2014-08-24 LAB — MAGNESIUM: Magnesium: 2.2 mg/dL (ref 1.7–2.4)

## 2014-08-24 LAB — PROTIME-INR
INR: 3.97 — AB (ref 0.00–1.49)
PROTHROMBIN TIME: 37.8 s — AB (ref 11.6–15.2)

## 2014-08-24 LAB — LACTIC ACID, PLASMA
LACTIC ACID, VENOUS: 2.6 mmol/L — AB (ref 0.5–2.0)
Lactic Acid, Venous: 2.7 mmol/L (ref 0.5–2.0)

## 2014-08-24 MED ORDER — METOPROLOL TARTRATE 1 MG/ML IV SOLN
2.5000 mg | Freq: Four times a day (QID) | INTRAVENOUS | Status: DC
  Administered 2014-08-24: 2.5 mg via INTRAVENOUS
  Filled 2014-08-24: qty 5

## 2014-08-24 MED ORDER — METOPROLOL TARTRATE 12.5 MG HALF TABLET
12.5000 mg | ORAL_TABLET | Freq: Two times a day (BID) | ORAL | Status: DC
  Administered 2014-08-24 – 2014-08-26 (×6): 12.5 mg via ORAL
  Filled 2014-08-24 (×8): qty 1

## 2014-08-24 MED ORDER — FUROSEMIDE 40 MG PO TABS
40.0000 mg | ORAL_TABLET | Freq: Every day | ORAL | Status: DC
  Administered 2014-08-24 – 2014-08-26 (×3): 40 mg via ORAL
  Filled 2014-08-24 (×5): qty 1

## 2014-08-24 MED ORDER — POTASSIUM CHLORIDE 20 MEQ/15ML (10%) PO SOLN
30.0000 meq | Freq: Once | ORAL | Status: AC
  Administered 2014-08-24: 30 meq via ORAL
  Filled 2014-08-24: qty 22.5

## 2014-08-24 MED ORDER — POTASSIUM CHLORIDE 10 MEQ/100ML IV SOLN
10.0000 meq | INTRAVENOUS | Status: DC
  Administered 2014-08-24 (×2): 10 meq via INTRAVENOUS
  Filled 2014-08-24 (×2): qty 100

## 2014-08-24 NOTE — Progress Notes (Signed)
ANTICOAGULATION CONSULT NOTE - Follow Up Consult  Pharmacy Consult for warfarin Indication: atrial fibrillation  Allergies  Allergen Reactions  . Morphine And Related Nausea And Vomiting    Patient Measurements: Height: 5\' 2"  (157.5 cm) Weight: 112 lb 7 oz (51 kg) IBW/kg (Calculated) : 50.1  Vital Signs: Temp: 97.8 F (36.6 C) (06/30 0854) Temp Source: Oral (06/30 0854) BP: 114/70 mmHg (06/30 0854) Pulse Rate: 124 (06/30 0854)  Labs:  Recent Labs  07/16/2014 0600 07/16/2014 0930 07/16/2014 1255 07/16/2014 1823 08/23/14 0030 08/24/14 0243  HGB 12.6  --   --   --  13.0 13.3  HCT 39.5  --   --   --  39.2 41.3  PLT 206  --   --   --  208 217  LABPROT 30.7*  --   --   --  29.2* 37.8*  INR 3.01*  --   --   --  2.82* 3.97*  CREATININE  --  0.65  --   --  0.91 0.78  TROPONINI  --   --  0.04* 0.04* 0.03  --     Estimated Creatinine Clearance: 42.1 mL/min (by C-G formula based on Cr of 0.78).  Assessment: [age] yof continues on chronic coumadin for afib. INR is now supratherapeutic at 3.97. CBC is WNL and no bleeding noted. Of note, pt was started on amiodarone 6/28. This interacts with warfarin and may cause and increased INR. Will require close monitoring. If pt to be discharged on amiodarone, her home regimen will likely need adjusting.    Goal of Therapy:  INR 2-3 Monitor platelets by anticoagulation protocol: Yes   Plan:  - No coumadin tonight - Daily INR  Kalisha Keadle, Drake Leachachel Lynn 08/24/2014,10:22 AM

## 2014-08-24 NOTE — Progress Notes (Signed)
Patient Profile: Tamara Bailey is an 81 y.o. year old female with a PMH of chronic Afib, CHA2DS2-VASc score=5, CVA 2006, HTN, breast CA s/p chemo and, PEG tube for malnutrition/dysphagia, dementia, fall 06/19 w/ hip repair (D/C 06/23), presenting from the [facility] with shortness of breath.   Subjective: Continues to have mild shortness of breath but feels like this is slowly improving. She is not having palpitations, dizziness, or lightheadedness. Denies any CP. Asking when she can go back to her nursing facility  Objective: Vital signs in last 24 hours: Temp:  [97.6 F (36.4 C)-98.2 F (36.8 C)] 97.8 F (36.6 C) (06/30 0854) Pulse Rate:  [101-131] 124 (06/30 0854) Resp:  [17-24] 17 (06/30 0854) BP: (112-147)/(62-86) 114/70 mmHg (06/30 0854) SpO2:  [94 %-100 %] 97 % (06/30 0854) Weight:  [51 kg (112 lb 7 oz)] 51 kg (112 lb 7 oz) (06/30 0458) Last BM Date: 08/21/14  Intake/Output from previous day: 06/29 0701 - 06/30 0700 In: 1381.7 [I.V.:183.7; NG/GT:948; IV Piggyback:250] Out: 905 [Urine:905] Intake/Output this shift: 0  Medications Current Facility-Administered Medications  Medication Dose Route Frequency Provider Last Rate Last Dose  . acetaminophen (TYLENOL) tablet 650 mg  650 mg Per Tube Q6H PRN Stephani Police, PA-C       Or  . acetaminophen (TYLENOL) suppository 650 mg  650 mg Rectal Q6H PRN Stephani Police, PA-C      . amiodarone (PACERONE) tablet 400 mg  400 mg Per Tube BID Lonia Blood, MD   400 mg at 08/24/14 1032  . bethanechol (URECHOLINE) tablet 10 mg  10 mg Per Tube TID Lonia Blood, MD   10 mg at 08/24/14 1033  . budesonide (PULMICORT) nebulizer solution 0.5 mg  0.5 mg Nebulization BID Stephani Police, PA-C   0.5 mg at 08/24/14 0835  . feeding supplement (OSMOLITE 1.5 CAL) liquid 237 mL  237 mL Per Tube 5 X Daily Stephani Police, PA-C   237 mL at 08/24/14 1033  . ferrous sulfate 300 (60 FE) MG/5ML syrup 300 mg  300 mg Per Tube QAC lunch  Ripudeep K Rai, MD   300 mg at 08/23/14 1134  . guaifenesin (ROBITUSSIN) 100 MG/5ML syrup 400 mg  400 mg Per Tube Q8H Marianne L York, PA-C   400 mg at 08/24/14 0500  . HYDROcodone-acetaminophen (NORCO/VICODIN) 5-325 MG per tablet 1 tablet  1 tablet Per Tube Q6H PRN Stephani Police, PA-C   1 tablet at 08/24/14 0510  . ipratropium (ATROVENT) nebulizer solution 0.5 mg  0.5 mg Nebulization QID Lonia Blood, MD   0.5 mg at 08/24/14 0820  . levalbuterol (XOPENEX) nebulizer solution 0.63 mg  0.63 mg Nebulization QID Lonia Blood, MD   0.63 mg at 08/24/14 1610  . levothyroxine (SYNTHROID, LEVOTHROID) tablet 50 mcg  50 mcg Per Tube QAC breakfast Ripudeep Jenna Luo, MD   50 mcg at 08/24/14 0500  . ondansetron (ZOFRAN) tablet 4 mg  4 mg Oral Q6H PRN Stephani Police, PA-C       Or  . ondansetron [facility]) injection 4 mg  4 mg Intravenous Q6H PRN Stephani Police, PA-C      . piperacillin-tazobactam (ZOSYN) IVPB 3.375 g  3.375 g Intravenous 3 times per day Armandina Stammer, RPH   3.375 g at 08/24/14 9604  . potassium chloride 10 mEq in 100 mL IVPB  10 mEq Intravenous Q1 Hr x 3 Drema Dallas, MD   10 mEq  at 08/24/14 1032  . potassium chloride 20 MEQ/15ML (10%) solution 40 mEq  40 mEq Per Tube BID Lonia BloodJeffrey T McClung, MD   40 mEq at 08/24/14 1033  . traMADol (ULTRAM) tablet 50 mg  50 mg Oral Q6H PRN Stephani PoliceMarianne L York, PA-C   50 mg at Feb 08, 2015 2139  . vancomycin (VANCOCIN) 500 mg in sodium chloride 0.9 % 100 mL IVPB  500 mg Intravenous Q24H Para MarchNathan J Batchelder, RPH   500 mg at 08/23/14 1002  . Warfarin - Pharmacist Dosing Inpatient   Does not apply q1800 Rosaland Laoachel L Rumbarger, RPH        PE: General appearance: alert, cooperative, appears stated age and no distress Neck: no adenopathy, no carotid bruit, no JVD, supple, symmetrical, trachea midline and thyroid not enlarged, symmetric, no tenderness/mass/nodules Lungs: rales LLL which is the dependent side, otherwise CTA Heart: Irregular.S1, S2 normal Abdomen:  soft, non-tender; bowel sounds normal; no masses,  no organomegaly and PEG tube LUQ Extremities: extremities normal, atraumatic, no cyanosis or edema Pulses: 2+ and symmetric Skin: Skin color, texture, turgor normal. No rashes or lesions Neurologic: Motor: LUE and LLE weakness. L facial droop.   Lab Results:   Recent Labs  Feb 08, 2015 0600 08/23/14 0030 08/24/14 0243  WBC 6.0 4.8 7.5  HGB 12.6 13.0 13.3  HCT 39.5 39.2 41.3  PLT 206 208 217   BMET  Recent Labs  Feb 08, 2015 0930 08/23/14 0030 08/24/14 0243  NA 132* 133* 135  K 4.4 3.2* 3.4*  CL 93* 90* 89*  CO2 25 26 34*  GLUCOSE 135* 206* 123*  BUN 35* 32* 35*  CREATININE 0.65 0.91 0.78  CALCIUM 9.6 9.1 9.1   PT/INR  Recent Labs  Feb 08, 2015 0600 08/23/14 0030 08/24/14 0243  LABPROT 30.7* 29.2* 37.8*  INR 3.01* 2.82* 3.97*   Telemetry AF with RVR, rates 101-137.   Assessment/Plan Principal Problem:   Sepsis Active Problems:   Hip fracture   CHF exacerbation   HCAP (healthcare-associated pneumonia)   Atrial fibrillation with rapid ventricular response   Chronic anticoagulation   Acute respiratory failure   Hypokalemia   1. Acute Systolic CHF:  - Dyspnea improving. Now euvolemic on exam. Start 40mg  PO lasix  - Weight is down to 51 kg from 55 kg yesterday. This is a bed-scale weight, likely inaccurate.   - I>O net positive +476. Also likely inaccurate as her volume status has clearly improved.   - Echo 08/24/2014 EF 25-30%, diffuse hypokinesis without regional variation, mild to moderate MR, moderate to severely reduced RV EF, moderate to severe TR, PA peak pressure 50mmHg  - her low EF may be caused by tachycardia vs stress from recent infection, she is a poor candidate for invasive workup. Given lack of chest pain, question if any benefit from noninvasive workup either  - can potentially increase metoprolol tartrate to 25mg  BID later if HR is a problem.  2. Sepsis due to HCAP/acute respiratory failure:   - PCT  0.14.  - Blood cx pending  - Management per primary team  3. Hypokalemia, mild:  - K+ 3.4.   - Primary team has replaced with KCl 20 mEq IV  4. Chronic AF:  - Remains in AF. HR during exam ranged from 90 to 130.   - Amiodarone switched to 400 mg PO BID, plan to transition to 200mg  BID after 7 days  - CHA2DS2-VASc 5. On chronic anticoagulation.   - INR supratherapeutic. Pharmacy dosing.   5. Recent L hip fracture  6. Hx CVA  7. Dysphagia with tube feeds via PEG   LOS: 2 days   Patient was seen with Franne Forts NP-S, plan discussed and physical exam performed separately  Signed, Azalee Course PA Pager: [phone]  Personally seen and examined. Agree with above. EF 30% AMIO to improve rate control Agree with no invasive workup.  Poor overall prognosis.   Donato Schultz, MD

## 2014-08-24 NOTE — Progress Notes (Signed)
CRITICAL VALUE ALERT  Critical value received:  Lactic Acid  Date of notification:  08/24/2014  Time of notification:  1240  Critical value read back:Yes.    Nurse who received alert:  Lorin PicketLindsey Tanajah Boulter, RN  MD notified (1st page):  Dr. Joseph ArtWoods  Time of first page:  1240  MD notified (2nd page):  Time of second page:  Responding MD:  Dr. Joseph ArtWoods  Time MD responded:  1240

## 2014-08-24 NOTE — Progress Notes (Signed)
[place] TEAM 1 - Stepdown/ICU TEAM Progress Note  Tamara PouchMary Ellen Bailey ZOX:[id number] DOB: 01/20/1930 DOA: Jan 22, 2015 PCP: Ginette OttoSTONEKING,HAL THOMAS, MD  Admit HPI / Brief Narrative: 81 y.o. F SNF resident with hx afib on coumadin, dementia, and stroke who presented with the sudden onset of shortness of breath. Thick white secretions were suction[facility] from [facility] throat [facility] by [facility] prior to coming to the ER. She has a PEG tube in place for dysphagia, and has been received PEG tube feedings as well as oral feedings. Lactic acid was 3.48, CBC noted elevated neutrophils. Chest x-ray suggested pulmonary edema with bilateral pleural effusions.   Per her sister Tamara Bailey [phone]) she had been having difficulty with thick white secretions for a long period of time. She had been becoming progressively weaker over several months.   HPI/Subjective: 6/30 A/O 4, states occasional SOB on oxygen at home but unsure how much. Negative CP, positive productive cough (yellow sputum). States has never smoked.  Assessment/Plan: Acute respiratory failure -Patient comfortable respirations appear to be much more comfortable at this time - wean oxygen as able  Sepsis due to Aspiration pneumonitis versus HCAP -The patient is not febrile, , no leukocytosis, however patient is coughing up thick yellow sputum.  -Would complete a seven-day course of antibiotics.  -PCXR in the a.m. -Flutter valve -Continue Pulmicort BID -Continue Atrovent and Xopenex QID -Lactic acid elevated 3.48 on admission; obtain lactic acid  Acute Systolic vs. Diastolic CHF: - Dyspnea improving. Still significantly hypervolemic on exam.  - BNP 1058.7. Unknown baseline weight. - Echo pending.  - Continue Lasix 40 mg IV BID. Not Cr increasing, monitor I/O after foley placement -Strict in and out since admission + 2.7 L -Daily weight; admission weight= 54.3 kg           6/30 weight= 51 kg  Chronic atrial  fibrillation -CHad2Vasc2 score 5 - care as per cardiology  - currently sinus tachycardia -Continue amiodarone 400 mg BID -Patient's pressures borderline but will try small amount of metoprolol. Metoprolol IV 2.5 mg QID -Continue warfarin per pharmacy  Right knee pain. -Denies pain today - follow clinically for now   Dementia -Patient is A/O 4, answering all questions appropriately to include knowing [facility]   Hypothyroidism -Continue Synthroid 50 g daily -6/28 TSH= 1.7  Hypokalemia -Potassium goal> 2 -Potassium IV 10 mEq 3 runs -Continue potassium 40 mEq BID -Recheck potassium and magnesium at 1500  Dysphagia -PEG tube in place. Utilize for feedings. Nothing by mouth for now. Speech consulted.    Code Status: NO CODE - DNR  Family Communication: no family present at time of exam Disposition Plan: SDU    Consultants: Dr.Mark Wilber Oliphant Skains (cardiology)    Procedure/Significant Events: 6/28 echocardiogram pending   Culture 6/28 blood right antecubital/forearm NGTD 6/28 MRSA by PCR negative   Antibiotics: Zosyn 6/28 > Vancomycin 6/28 >   DVT prophylaxis: Warfarin   Devices    LINES / TUBES:      Continuous Infusions:   Objective: VITAL SIGNS: Temp: 97.8 F (36.6 C) (06/30 0854) Temp Source: Oral (06/30 0854) BP: 114/70 mmHg (06/30 0854) Pulse Rate: 124 (06/30 0854) SPO2; FIO2:   Intake/Output Summary (Last 24 hours) at 08/24/14 1046 Last data filed at 08/24/14 0619  Gross per 24 hour  Intake  994.6 ml  Output    725 ml  Net  269.6 ml     Exam: General: A/O 4, NAD, No acute respiratory distress Eyes: Negative  headache, eye pain, double vision,  negative retinal hemorrhage ENT: Negative Runny nose, negative ear pain, negative tinnitus, negative gingival bleeding Neck:  Negative scars, masses, torticollis, lymphadenopathy, JVD Lungs: Clear to auscultation bilaterally, negative wheezing, positive bibasilar  crackles Cardiovascular: Tachycardic, Regular rhythm without murmur gallop or rub normal S1 and S2 Abdomen:negative abdominal pain, negative dysphagia, Nontender, nondistended, soft, bowel sounds positive, no rebound, no ascites, no appreciable mass Extremities: No significant cyanosis, clubbing, or edema bilateral lower extremities Psychiatric:  Negative depression, negative anxiety, negative fatigue, negative mania  Neurologic:  Cranial nerves II through XII intact, tongue/uvula midline, all extremities muscle strength 5/5, sensation intact throughout, negative dysarthria, negative expressive aphasia, negative receptive aphasia.       Data Reviewed: Basic Metabolic Panel:  Recent Labs Lab 08/17/2014 0930 08/23/14 0030 08/24/14 0243  NA 132* 133* 135  K 4.4 3.2* 3.4*  CL 93* 90* 89*  CO2 25 26 34*  GLUCOSE 135* 206* 123*  BUN 35* 32* 35*  CREATININE 0.65 0.91 0.78  CALCIUM 9.6 9.1 9.1   Liver Function Tests:  Recent Labs Lab 08/23/14 0030 08/24/14 0243  AST 96* 58*  ALT 61* 52  ALKPHOS 172* 160*  BILITOT 1.3* 1.3*  PROT 6.5 6.7  ALBUMIN 3.4* 3.3*   No results for input(s): LIPASE, AMYLASE in the last 168 hours. No results for input(s): AMMONIA in the last 168 hours. CBC:  Recent Labs Lab 08/13/2014 0600 08/23/14 0030 08/24/14 0243  WBC 6.0 4.8 7.5  NEUTROABS 5.2  --   --   HGB 12.6 13.0 13.3  HCT 39.5 39.2 41.3  MCV 91.0 90.3 90.8  PLT 206 208 217   Cardiac Enzymes:  Recent Labs Lab 08/19/2014 1255 08/11/2014 1823 08/23/14 0030  TROPONINI 0.04* 0.04* 0.03   BNP (last 3 results)  Recent Labs  07/27/2014 0600  BNP 1058.7*    ProBNP (last 3 results) No results for input(s): PROBNP in the last 8760 hours.  CBG:  Recent Labs Lab 08/23/14 1646 08/23/14 2118 08/23/14 2353 08/24/14 0404 08/24/14 0857  GLUCAP 189* 126* 160* 138* 149*    Recent Results (from the past 240 hour(s))  Blood culture (routine x 2)     Status: None (Preliminary result)    Collection Time: 08/21/2014  6:00 AM  Result Value Ref Range Status   Specimen Description BLOOD RIGHT ANTECUBITAL  Final   Special Requests BOTTLES DRAWN AEROBIC AND ANAEROBIC  Final   Culture NO GROWTH 1 DAY  Final   Report Status PENDING  Incomplete  Blood culture (routine x 2)     Status: None (Preliminary result)   Collection Time: 07/31/2014  9:20 AM  Result Value Ref Range Status   Specimen Description BLOOD BLOOD RIGHT FOREARM  Final   Special Requests BOTTLES DRAWN AEROBIC AND ANAEROBIC  Final   Culture NO GROWTH 1 DAY  Final   Report Status PENDING  Incomplete  MRSA PCR Screening     Status: None   Collection Time: 08/18/2014 12:30 PM  Result Value Ref Range Status   MRSA by PCR NEGATIVE NEGATIVE Final    Comment:        The GeneXpert MRSA Assay (FDA approved for NASAL specimens only), is one component of a comprehensive MRSA colonization surveillance program. It is not intended to diagnose MRSA infection nor to guide or monitor treatment for MRSA infections.      Studies:  Recent x-ray studies have been reviewed in detail by the Attending Physician  Scheduled  Meds:  Scheduled Meds: . amiodarone  400 mg Per Tube BID  . bethanechol  10 mg Per Tube TID  . budesonide (PULMICORT) nebulizer solution  0.5 mg Nebulization BID  . feeding supplement (OSMOLITE 1.5 CAL)  237 mL Per Tube 5 X Daily  . ferrous sulfate  300 mg Per Tube QAC lunch  . guaifenesin  400 mg Per Tube Q8H  . ipratropium  0.5 mg Nebulization QID  . levalbuterol  0.63 mg Nebulization QID  . levothyroxine  50 mcg Per Tube QAC breakfast  . piperacillin-tazobactam (ZOSYN)  IV  3.375 g Intravenous 3 times per day  . potassium chloride  10 mEq Intravenous Q1 Hr x 3  . potassium chloride  40 mEq Per Tube BID  . vancomycin  500 mg Intravenous Q24H  . Warfarin - Pharmacist Dosing Inpatient   Does not apply q1800    Time spent on care of this patient: 40 mins   Ata Pecha, Roselind Messier , MD  [facility] Office  [phone] Pager - [phone]  On-Call/Text Page:      [email or web address]      password [email or web address]  If 7PM-7AM, please contact night-coverage [email or web address] Password [email or web address] 08/24/2014, 10:46 AM   LOS: 2 days   Care during the described time interval was provided by me .  I have reviewed this patient's available data, including medical history, events of note, physical examination, and all test results as part of my evaluation. I have personally reviewed and interpreted all radiology studies.   Carolyne Littles, MD [phone] Pager

## 2014-08-24 NOTE — Progress Notes (Signed)
Echocardiogram 2D Echocardiogram has been performed.  Dorothey BasemanReel, Kedar Sedano M 08/24/2014, 10:17 AM

## 2014-08-25 DIAGNOSIS — Z7901 Long term (current) use of anticoagulants: Secondary | ICD-10-CM | POA: Insufficient documentation

## 2014-08-25 LAB — COMPREHENSIVE METABOLIC PANEL
ALBUMIN: 3.4 g/dL — AB (ref 3.5–5.0)
ALK PHOS: 161 U/L — AB (ref 38–126)
ALT: 51 U/L (ref 14–54)
AST: 53 U/L — ABNORMAL HIGH (ref 15–41)
Anion gap: 12 (ref 5–15)
BUN: 37 mg/dL — ABNORMAL HIGH (ref 6–20)
CO2: 37 mmol/L — ABNORMAL HIGH (ref 22–32)
Calcium: 9.1 mg/dL (ref 8.9–10.3)
Chloride: 91 mmol/L — ABNORMAL LOW (ref 101–111)
Creatinine, Ser: 0.8 mg/dL (ref 0.44–1.00)
GFR calc Af Amer: >60 mL/min (ref >60)
GLUCOSE: 105 mg/dL — AB (ref 65–99)
Potassium: 4 mmol/L (ref 3.5–5.1)
Sodium: 140 mmol/L (ref 135–145)
Total Bilirubin: 0.9 mg/dL (ref 0.3–1.2)
Total Protein: 6.7 g/dL (ref 6.5–8.1)

## 2014-08-25 LAB — GLUCOSE, CAPILLARY
GLUCOSE-CAPILLARY: 106 mg/dL — AB (ref 65–99)
GLUCOSE-CAPILLARY: 135 mg/dL — AB (ref 65–99)
GLUCOSE-CAPILLARY: 138 mg/dL — AB (ref 65–99)
GLUCOSE-CAPILLARY: 159 mg/dL — AB (ref 65–99)
GLUCOSE-CAPILLARY: 161 mg/dL — AB (ref 65–99)
GLUCOSE-CAPILLARY: 170 mg/dL — AB (ref 65–99)
Glucose-Capillary: 110 mg/dL — ABNORMAL HIGH (ref 65–99)

## 2014-08-25 LAB — PROTIME-INR
INR: 4.14 — AB (ref 0.00–1.49)
Prothrombin Time: 39 seconds — ABNORMAL HIGH (ref 11.6–15.2)

## 2014-08-25 LAB — CBC
HCT: 42.8 % (ref 36.0–46.0)
HEMOGLOBIN: 13.4 g/dL (ref 12.0–15.0)
MCH: 29.1 pg (ref 26.0–34.0)
MCHC: 31.3 g/dL (ref 30.0–36.0)
MCV: 92.8 fL (ref 78.0–100.0)
Platelets: 234 10*3/uL (ref 150–400)
RBC: 4.61 MIL/uL (ref 3.87–5.11)
RDW: 16.7 % — AB (ref 11.5–15.5)
WBC: 7.6 10*3/uL (ref 4.0–10.5)

## 2014-08-25 LAB — MAGNESIUM: Magnesium: 2.4 mg/dL (ref 1.7–2.4)

## 2014-08-25 LAB — PROCALCITONIN: Procalcitonin: <0.1 ng/mL

## 2014-08-25 MED ORDER — LEVALBUTEROL HCL 0.63 MG/3ML IN NEBU: 0.6300 mg | INHALATION_SOLUTION | RESPIRATORY_TRACT | Status: DC | PRN

## 2014-08-25 MED ORDER — IPRATROPIUM BROMIDE 0.02 % IN SOLN: 0.5000 mg | RESPIRATORY_TRACT | Status: DC | PRN

## 2014-08-25 MED ORDER — AMIODARONE HCL 200 MG PO TABS
200.0000 mg | ORAL_TABLET | Freq: Two times a day (BID) | ORAL | Status: DC
  Administered 2014-08-25 – 2014-08-28 (×7): 200 mg
  Filled 2014-08-25 (×9): qty 1

## 2014-08-25 MED ORDER — LISINOPRIL 2.5 MG PO TABS
2.5000 mg | ORAL_TABLET | Freq: Every day | ORAL | Status: DC
  Administered 2014-08-25 – 2014-08-28 (×4): 2.5 mg
  Filled 2014-08-25 (×5): qty 1

## 2014-08-25 NOTE — Progress Notes (Signed)
Pt transferred to [address] [place] with all belongings per Nurse tech transferred per recliner

## 2014-08-25 NOTE — Progress Notes (Signed)
Physical Therapy Treatment Patient Details Name: Tamara Bailey MRN: [id number] DOB: 06/29/1929 Today's Date: 08/25/2014    History of Present Illness Tamara Bailey is a 81 y.o. 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 Patient had a left hip repair 08/14/13. Presented with acute shortness of breath and difficulty breathing chest x-ray showed pulmonary edema with bilateral pleural effusions    PT Comments    Pt moving well today, increased mobility and gait and states she is feeling well. Pt with decreased incontinence today and able to mobilize further. Pt encouraged to continue gait and HEp with assist and will continue to follow to maximize function.   Follow Up Recommendations  SNF     Equipment Recommendations       Recommendations for Other Services       Precautions / Restrictions Precautions Precautions: Fall Precaution Comments: incontinent Restrictions Weight Bearing Restrictions: No    Mobility  Bed Mobility Overal bed mobility: Needs Assistance Bed Mobility: Supine to Sit     Supine to sit: Min guard     General bed mobility comments: cues for sequence without physical assist with use of rail   Transfers Overall transfer level: Needs assistance   Transfers: Sit to/from Stand Sit to Stand: Min assist         General transfer comment: cues for hand placement and safety  Ambulation/Gait Ambulation/Gait assistance: Min assist;+2 safety/equipment Ambulation Distance (Feet): 40 Feet Assistive device: Rolling walker (2 wheeled) Gait Pattern/deviations: Step-through pattern;Decreased stride length   Gait velocity interpretation: Below normal speed for age/gender General Gait Details: cues for posture and position in RW   Stairs            Wheelchair Mobility    Modified  Rankin (Stroke Patients Only)       Balance Overall balance assessment: Needs assistance   Sitting balance-Leahy Scale: Fair       Standing balance-Leahy Scale: Poor                      Cognition Arousal/Alertness: Awake/alert Behavior During Therapy: WFL for tasks assessed/performed Overall Cognitive Status: History of cognitive impairments - at baseline                      Exercises General Exercises - Lower Extremity Long Arc Quad: AROM;Seated;Both;15 reps Hip ABduction/ADduction: AROM;Seated;Both;15 reps Hip Flexion/Marching: AROM;Seated;Both;15 reps    General Comments        Pertinent Vitals/Pain Pain Assessment: No/denies pain  HR 100-108 with gait sats 100% on 2L    Home Living                      Prior Function            PT Goals (current goals can now be found in the care plan section) Progress towards PT goals: Progressing toward goals    Frequency       PT Plan Current plan remains appropriate    Co-evaluation             End of Session Equipment Utilized During Treatment: Gait belt;Oxygen Activity Tolerance: Patient tolerated treatment well Patient left: in chair;with call bell/phone within reach;with chair alarm set     Time: [id number] PT Time Calculation (min) (ACUTE ONLY): 23 min  Charges:  $Gait Training: 8-22  mins $Therapeutic Exercise: 8-22 mins                    G Codes:      Delorse Lek 09/22/2014, 10:49 AM Delaney Meigs, PT [phone]

## 2014-08-25 NOTE — Progress Notes (Addendum)
Patient Profile: Tamara Bailey is an 81 y.o. year old female with a PMH of chronic Afib, CHA2DS2-VASc score=5, CVA 2006, HTN, breast CA s/p chemo and, PEG tube for malnutrition/dysphagia, dementia, fall 06/19 w/ hip repair (D/C 06/23), presenting from the [facility] with shortness of breath.   Subjective: Continues to have mild shortness of breath but feels like this is slowly improving. She is not having palpitations, dizziness, or lightheadedness. Denies any CP. Asking when she can go back to her nursing facility  Objective: Vital signs in last 24 hours: Temp:  [97.4 F (36.3 C)-98.2 F (36.8 C)] 98 F (36.7 C) (07/01 0813) Pulse Rate:  [91-135] 115 (07/01 0813) Resp:  [11-31] 16 (07/01 0813) BP: (113-125)/(63-92) 125/92 mmHg (07/01 0813) SpO2:  [95 %-100 %] 100 % (07/01 0813) FiO2 (%):  [28 %] 28 % (07/01 0757) Weight:  [104 lb 8 oz (47.4 kg)] 104 lb 8 oz (47.4 kg) (07/01 0500) Last BM Date: 08/24/14  Intake/Output from previous day: 06/30 0701 - 07/01 0700 In: 1385 [NG/GT:1185; IV Piggyback:200] Out: 725 [Urine:725] Intake/Output this shift: 0  Medications Current Facility-Administered Medications  Medication Dose Route Frequency Provider Last Rate Last Dose  . acetaminophen (TYLENOL) tablet 650 mg  650 mg Per Tube Q6H PRN Stephani Police, PA-C       Or  . acetaminophen (TYLENOL) suppository 650 mg  650 mg Rectal Q6H PRN Stephani Police, PA-C      . amiodarone (PACERONE) tablet 400 mg  400 mg Per Tube BID Lonia Blood, MD   400 mg at 08/24/14 2154  . bethanechol (URECHOLINE) tablet 10 mg  10 mg Per Tube TID Lonia Blood, MD   10 mg at 08/24/14 2153  . budesonide (PULMICORT) nebulizer solution 0.5 mg  0.5 mg Nebulization BID Stephani Police, PA-C   0.5 mg at 08/25/14 0756  . feeding supplement (OSMOLITE 1.5 CAL) liquid 237 mL  237 mL Per Tube 5 X Daily Stephani Police, PA-C   237 mL at 08/25/14 0644  . ferrous sulfate 300 (60 FE) MG/5ML syrup 300 mg   300 mg Per Tube QAC lunch Ripudeep K Rai, MD   300 mg at 08/24/14 1128  . furosemide (LASIX) tablet 40 mg  40 mg Oral Daily Azalee Course, PA   40 mg at 08/24/14 1442  . guaifenesin (ROBITUSSIN) 100 MG/5ML syrup 400 mg  400 mg Per Tube Q8H Marianne L York, PA-C   400 mg at 08/25/14 0644  . HYDROcodone-acetaminophen (NORCO/VICODIN) 5-325 MG per tablet 1 tablet  1 tablet Per Tube Q6H PRN Stephani Police, PA-C   1 tablet at 08/24/14 0510  . ipratropium (ATROVENT) nebulizer solution 0.5 mg  0.5 mg Nebulization QID Lonia Blood, MD   0.5 mg at 08/25/14 0756  . levalbuterol (XOPENEX) nebulizer solution 0.63 mg  0.63 mg Nebulization QID Lonia Blood, MD   0.63 mg at 08/25/14 0756  . levothyroxine (SYNTHROID, LEVOTHROID) tablet 50 mcg  50 mcg Per Tube QAC breakfast Ripudeep Jenna Luo, MD   50 mcg at 08/25/14 0644  . metoprolol tartrate (LOPRESSOR) tablet 12.5 mg  12.5 mg Oral BID Azalee Course, PA   12.5 mg at 08/24/14 2153  . ondansetron (ZOFRAN) tablet 4 mg  4 mg Oral Q6H PRN Stephani Police, PA-C       Or  . ondansetron (ZOFRAN) injection 4 mg  4 mg Intravenous Q6H PRN Stephani Police, PA-C   4  mg at 08/25/14 0607  . piperacillin-tazobactam (ZOSYN) IVPB 3.375 g  3.375 g Intravenous 3 times per day Armandina StammerNathan J Batchelder, RPH   3.375 g at 08/25/14 [id number]  . potassium chloride 20 MEQ/15ML (10%) solution 40 mEq  40 mEq Per Tube BID Lonia BloodJeffrey T McClung, MD   40 mEq at 08/24/14 2153  . traMADol (ULTRAM) tablet 50 mg  50 mg Oral Q6H PRN Stephani PoliceMarianne L York, PA-C   50 mg at 08/24/14 2222  . vancomycin (VANCOCIN) 500 mg in sodium chloride 0.9 % 100 mL IVPB  500 mg Intravenous Q24H Para MarchNathan J Batchelder, RPH   500 mg at 08/24/14 1111  . Warfarin - Pharmacist Dosing Inpatient   Does not apply q1800 Rosaland LaoRachel L Rumbarger, [facility]naRPH   Stopped at 08/24/14 1800    PE: General appearance: alert, cooperative, appears stated age and no distress Neck: no adenopathy, no carotid bruit, supple, symmetrical, trachea midline and thyroid not  enlarged, symmetric, no tenderness/mass/nodules +JVD Lungs: Bibasilar rale, occasional rhonchi  Heart: Irregular.S1, S2 normal Abdomen: soft, non-tender; bowel sounds normal; no masses,  no organomegaly and PEG tube LUQ Extremities: extremities normal, atraumatic, no cyanosis or edema Pulses: 2+ and symmetric Skin: Skin color, texture, turgor normal. No rashes or lesions Neurologic: Motor: LUE and LLE weakness. L facial droop.   Lab Results:   Recent Labs  08/24/14 0243 08/24/14 1138 08/25/14 0300  WBC 7.5 7.0 7.6  HGB 13.3 12.6 13.4  HCT 41.3 39.1 42.8  PLT 217 214 234   BMET  Recent Labs  08/24/14 0243 08/24/14 1138 08/25/14 0300  NA 135 136 140  K 3.4* 3.7 4.0  CL 89* 91* 91*  CO2 34* 32 37*  GLUCOSE 123* 177* 105*  BUN 35* 36* 37*  CREATININE 0.78 0.81 0.80  CALCIUM 9.1 8.8* 9.1   PT/INR  Recent Labs  08/23/14 0030 08/24/14 0243 08/25/14 0300  LABPROT 29.2* 37.8* 39.0*  INR 2.82* 3.97* 4.14*   Telemetry AF with RVR, rates 101-137.   Assessment/Plan Principal Problem:   Sepsis Active Problems:   Hip fracture   CHF exacerbation   HCAP (healthcare-associated pneumonia)   Atrial fibrillation with rapid ventricular response   Chronic anticoagulation   Acute respiratory failure   Hypokalemia   Acute respiratory failure with hypoxia   Sepsis due to pneumonia   Acute systolic CHF (congestive heart failure)   Chronic atrial fibrillation   Right knee pain   Dementia   Other specified hypothyroidism   Dysphagia   1. Acute Systolic CHF:  - Dyspnea improving. Now euvolemic on exam. Start 40mg  PO lasix  - Weight is down to 51 kg from 55 kg yesterday. This is a bed-scale weight, likely inaccurate.   - I>O net positive +476. Also likely inaccurate as her volume status has clearly improved.   - Echo 08/24/2014 EF 25-30%, diffuse hypokinesis without regional variation, mild to moderate MR, moderate to severely reduced RV EF, moderate to severe TR, PA peak  pressure 50mmHg  - her low EF may be caused by tachycardia vs stress from recent infection, she is a poor candidate for invasive workup.  - HR 100-103 last night, SBP 110s, will not increase metoprolol further given her frailty and risk for fall. May consider ABG to assess her O2 requirement and respiratory status.  2. Sepsis due to HCAP/acute respiratory failure:   - PCT 0.14.  - Blood cx neg to date  - Management per primary team  3. Hypokalemia, mild:  - K+ 3.4.   -  Primary team has replaced with KCl 20 mEq IV  4. Chronic AF:  - Remains in AF. HR during exam ranged from 90 to 130.   - Amiodarone switched to 400 mg PO BID, plan to transition to  BID after 7 days (on July 7th)  - CHA2DS2-VASc 5. On chronic anticoagulation.   - INR supratherapeutic. Pharmacy dosing.   5. Recent L hip fracture  6. Hx CVA  7. Dysphagia with tube feeds via PEG   LOS: 3 days   Patient seen with Franne Forts NP-S, treatment plan discussed, physical exam performed separately  Signed, Azalee Course PA Pager: [phone]  Personally seen and examined. Agree with above. Continue with amiodarone taper/load. Will go ahead and change to 200 BID given her age Heart rate under better control EF 25% Clinical improvement noted  Discussed with Dr. Sharon Seller Will sign off. Please call if ?  Donato Schultz, MD

## 2014-08-25 NOTE — Progress Notes (Signed)
ANTICOAGULATION  Antibiotic CONSULT NOTE - Follow Up Consult  Pharmacy Consult for Warfarin / Vancomycin and Zosyn Indication: atrial fibrillation / pneumonia  Allergies  Allergen Reactions  . Morphine And Related Nausea And Vomiting   Assessment: 85 yof continues on chronic coumadin for afib. INR continues to be supra-therapeutic (due to amiodarone). Continues on broad spectrum antibiotics for pneumonia (Day # 4), Scr stable, cultures negative, now afebrile  Goal of Therapy:  INR 2-3 Monitor platelets by anticoagulation protocol: Yes  Appropriate antibiotic dosing   Plan:  - No coumadin tonight - Daily INR - Continue Vancomycin and Zosyn at current doses -- de-escalate?       Labs:  Recent Labs  04/08/2014 1255 04/08/2014 1823  08/23/14 0030 08/24/14 0243 08/24/14 1138 08/25/14 0300  HGB  --   --   < > 13.0 13.3 12.6 13.4  HCT  --   --   < > 39.2 41.3 39.1 42.8  PLT  --   --   < > 208 217 214 234  LABPROT  --   --   --  29.2* 37.8*  --  39.0*  INR  --   --   --  2.82* 3.97*  --  4.14*  CREATININE  --   --   < > 0.91 0.78 0.81 0.80  TROPONINI 0.04* 0.04*  --  0.03  --   --   --   < > = values in this interval not displayed.  Estimated Creatinine Clearance: 39.9 mL/min (by C-G formula based on Cr of 0.8).  Thank you Okey RegalLisa Harleyquinn Gasser, PharmD [phone]  08/25/2014,9:46 AM

## 2014-08-25 NOTE — Progress Notes (Signed)
Pt transferred from [facility], pt alert to self, VSS, pt oriented to room, call bell given to pt, pt stable

## 2014-08-25 NOTE — Progress Notes (Signed)
[place] TEAM 1 - Stepdown/ICU TEAM Progress Note  Tamara Bailey UJW:[id number] DOB: [birth year] DOA: 2014/08/28 PCP: Ginette Otto, MD  Admit HPI / Brief Narrative: 81 y.o. F SNF resident with hx afib on coumadin, dementia, and stroke who presented with the sudden onset of shortness of breath.  Thick white secretions were suctioned from her throat at [facility] home by EMS prior to coming to the ER.  She has a PEG tube in place for dysphagia, and has been received PEG tube feedings as well as oral feedings. Lactic acid was 3.48, CBC noted elevated neutrophils. Chest x-ray suggested pulmonary edema with bilateral pleural effusions.   Per her sister [name] [phone]) she had been having difficulty with thick white secretions for a long period of time. She had been becoming progressively weaker over several months.  HPI/Subjective: The patient is resting comfortably in a bedside chair.  She states her shortness of breath has resolved.  She feels much better and is asking for ice chips.  She denies chest pain nausea vomiting or abdominal pain.  She is motivated to get back to the [place] home.  Assessment/Plan:   Acute respiratory failure respirations appear to be much more comfortable at this time - appears her acute distress was a combination of probable aspiration pneumonitis plus pulmonary edema/systolic heart failure as well as atrial fibrillation with RVR - she is presently medically stable but will be at high risk for acute decompensation  Sepsis due to Aspiration pneumonitis versus HCAP The patient has not been febrile nor has she had an elevated white blood cell count - likewise pro-calcitonin has not been consistent with a significant pulmonary infection - discontinue antibiotics as she has completed a 3 day course  Acute on Chronic Severe Systolic CHF Admit wgt 54.3 kg > current 47.4kg - I/O net +3.4L thus far for admit which is not consistent with her weight loss or  her physical exam  Chronic atrial fibrillation CHad2Vasc2 score 5 - amiodarone per Cardiology - remains on warfarin  Right knee pain. Denies pain today - follow clinically for now   Dementia Patient is alert to person and place - she is able to appropriately answer questions - per her sister the patient's dementia has become much worse recently   Hypothyroidism Continue Synthroid  History of dysphagia PEG tube in place.  Utilize for feedings.  Allow occasional ice chips with acceptance of aspiration.  Oral care will be very important as SLP noted thick caked secretions in mouth during evaluation.  Code Status: NO CODE - DNR  Family Communication: no family present at time of exam Disposition Plan: Stable for transfer to telemetry bed - continue to work with PT/OT - if remains stable over next 24/48 hours plan will be to return to SNF  Consultants: Cardiology  Procedures: 6/30 - TTE - EF 25-30% - diffuse hypokinesis - RV overload / systolic dysfxn - mod/severe tricuspid regurg   Antibiotics: Zosyn 6/28 > 7/1 Vancomycin 6/28 > 7/1  DVT prophylaxis: Warfarin   Objective: Blood pressure 125/92, pulse 115, temperature 98 F (36.7 C), temperature source Axillary, resp. rate 16, height  (1.575 m), weight 47.4 kg (104 lb 8 oz), SpO2 100 %.  Intake/Output Summary (Last 24 hours) at 08/25/14 0936 Last data filed at 08/25/14 0644  Gross per 24 hour  Intake   1385 ml  Output    725 ml  Net    660 ml   Exam: General: No acute respiratory distress sitting  up in bedside chair Lungs: Clear to auscultation bilaterally without wheezes or crackles  Cardiovascular: Regular rate without murmur gallop or rub normal S1 and S2 Abdomen: Nontender, nondistended, soft, bowel sounds positive, no rebound, no ascites, no appreciable mass Extremities: No significant cyanosis, clubbing, edema bilateral lower extremities  Data Reviewed: Basic Metabolic Panel:  Recent Labs Lab  08/21/2014 0930 08/23/14 0030 08/24/14 0243 08/24/14 1138 08/25/14 0300  NA 132* 133* 135 136 140  K 4.4 3.2* 3.4* 3.7 4.0  CL 93* 90* 89* 91* 91*  CO2 25 26 34* 32 37*  GLUCOSE 135* 206* 123* 177* 105*  BUN 35* 32* 35* 36* 37*  CREATININE 0.65 0.91 0.78 0.81 0.80  CALCIUM 9.6 9.1 9.1 8.8* 9.1  MG  --   --   --  2.2 2.4    CBC:  Recent Labs Lab 08/04/2014 0600 08/23/14 0030 08/24/14 0243 08/24/14 1138 08/25/14 0300  WBC 6.0 4.8 7.5 7.0 7.6  NEUTROABS 5.2  --   --  6.0  --   HGB 12.6 13.0 13.3 12.6 13.4  HCT 39.5 39.2 41.3 39.1 42.8  MCV 91.0 90.3 90.8 91.4 92.8  PLT 206 208 217 214 234    Liver Function Tests:  Recent Labs Lab 08/23/14 0030 08/24/14 0243 08/24/14 1138 08/25/14 0300  AST 96* 58* 49* 53*  ALT 61* 52 47 51  ALKPHOS 172* 160* 149* 161*  BILITOT 1.3* 1.3* 1.1 0.9  PROT 6.5 6.7 6.4* 6.7  ALBUMIN 3.4* 3.3* 3.2* 3.4*    Coags:  Recent Labs Lab 08/01/2014 0600 08/23/14 0030 08/24/14 0243 08/25/14 0300  INR 3.01* 2.82* 3.97* 4.14*   Cardiac Enzymes:  Recent Labs Lab 07/29/2014 1255 07/31/2014 1823 08/23/14 0030  TROPONINI 0.04* 0.04* 0.03    CBG:  Recent Labs Lab 08/24/14 1642 08/24/14 2028 08/25/14 0037 08/25/14 0441 08/25/14 0802  GLUCAP 162* 167* 138* 110* 161*    Recent Results (from the past 240 hour(s))  Blood culture (routine x 2)     Status: None (Preliminary result)   Collection Time: 07/28/2014  6:00 AM  Result Value Ref Range Status   Specimen Description BLOOD RIGHT ANTECUBITAL  Final   Special Requests BOTTLES DRAWN AEROBIC AND ANAEROBIC  Final   Culture NO GROWTH 2 DAYS  Final   Report Status PENDING  Incomplete  Blood culture (routine x 2)     Status: None (Preliminary result)   Collection Time: 08/03/2014  9:20 AM  Result Value Ref Range Status   Specimen Description BLOOD BLOOD RIGHT FOREARM  Final   Special Requests BOTTLES DRAWN AEROBIC AND ANAEROBIC  Final   Culture NO GROWTH 2 DAYS  Final   Report  Status PENDING  Incomplete  MRSA PCR Screening     Status: None   Collection Time: 08/20/2014 12:30 PM  Result Value Ref Range Status   MRSA by PCR NEGATIVE NEGATIVE Final    Comment:        The GeneXpert MRSA Assay (FDA approved for NASAL specimens only), is one component of a comprehensive MRSA colonization surveillance program. It is not intended to diagnose MRSA infection nor to guide or monitor treatment for MRSA infections.      Studies:   Recent x-ray studies have been reviewed in detail by the Attending Physician  Scheduled Meds:  Scheduled Meds: . amiodarone  400 mg Per Tube BID  . bethanechol  10 mg Per Tube TID  . budesonide (PULMICORT) nebulizer solution  0.5 mg Nebulization BID  .  feeding supplement (OSMOLITE 1.5 CAL)  237 mL Per Tube 5 X Daily  . ferrous sulfate  300 mg Per Tube QAC lunch  . furosemide  40 mg Oral Daily  . guaifenesin  400 mg Per Tube Q8H  . ipratropium  0.5 mg Nebulization QID  . levalbuterol  0.63 mg Nebulization QID  . levothyroxine  50 mcg Per Tube QAC breakfast  . metoprolol tartrate  12.5 mg Oral BID  . piperacillin-tazobactam (ZOSYN)  IV  3.375 g Intravenous 3 times per day  . potassium chloride  40 mEq Per Tube BID  . vancomycin  500 mg Intravenous Q24H  . Warfarin - Pharmacist Dosing Inpatient   Does not apply q1800    Time spent on care of this patient: 35 mins   [facility] KansasMCCLUNG,Nohelia Valenza T , MD   [facility] Office  [phone] Pager - Text Page per Amion as per below:  On-Call/Text Page:      [email or web address]      password [email or web address]  If 7PM-7AM, please contact night-coverage [email or web address] Password [email or web address] 08/25/2014, 9:36 AM   LOS: 3 days

## 2014-08-25 NOTE — Progress Notes (Signed)
Called report to 3 east spoke to SCA[employer] Corporationjess RN.

## 2014-08-25 DEATH — deceased

## 2014-08-26 DIAGNOSIS — I482 Chronic atrial fibrillation: Secondary | ICD-10-CM

## 2014-08-26 LAB — GLUCOSE, CAPILLARY
Glucose-Capillary: 199 mg/dL — ABNORMAL HIGH (ref 65–99)
Glucose-Capillary: 182 mg/dL — ABNORMAL HIGH (ref 65–99)
Glucose-Capillary: 142 mg/dL — ABNORMAL HIGH (ref 65–99)
Glucose-Capillary: 228 mg/dL — ABNORMAL HIGH (ref 65–99)

## 2014-08-26 LAB — PROTIME-INR
INR: 4.01 — ABNORMAL HIGH (ref 0.00–1.49)
Prothrombin Time: 38 s — ABNORMAL HIGH (ref 11.6–15.2)

## 2014-08-26 NOTE — Progress Notes (Signed)
PATIENT DETAILS Name: Tamara Bailey Age: 81 y.o. Sex: female Date of Birth: 15-Oct-1929 Admit Date: 08/09/2014 Admitting Physician Ripudeep Jenna Luo, MD NFA:[id number],ION Maisie Fus, MD  Subjective: Lying comfortably. Denies SOB. Seems to be pleasantly confused.  Assessment/Plan: Principal Problem: Sepsis due to Aspiration pneumonitis versus HCAP:resolved. Completed IV Abx, felt to have more of acute CHF rather than PNA.  Active Problems: Acute respiratory failure:felt to be a combination of PNA and acute systolic CHF. Titrate off O2 as tolerated.Felt to be at risk for aspiration-will need oral care. Already NPO-feeds through peg tube. Family aware of poor overall prognoses.   Acute on Chronic Severe Systolic GEX:BMWUXLKG and now compensated. Weight down to 104 lb (119 lb on admit). Cardiology consulted. Now transitioned to oral Lasix.   Chronic atrial fibrillation:EF-25% to 30% per Echo June 2016.CHad2Vasc2 score 5 - amiodarone per Cardiology - remains on warfarin-INR still supratherapeutic. Spoke with sister [name] over the phone [phone])-explained risk vs benefits of anticoagulation-family accepting risk-especially fall risk-therefore will continue. Will need to readdressed in the near future-especially if patient sustains another fall.  History of dysphagia:PEG tube in place.On bolus feeds.  Hypothyroidism:Continue Synthroid  Dementia: appears close to usual baseline.   Right knee pain:Denies pain-no swelling or erythema seen- follow clinically for now   Hx of CVA: on coumadin-hx of Afib  Disposition: Remain inpatient-SNF tomorrow  Antimicrobial agents  See below  Anti-infectives    Start     Dose/Rate Route Frequency Ordered Stop   08/23/14 1000  vancomycin (VANCOCIN) 500 mg in sodium chloride 0.9 % 100 mL IVPB  Status:  Discontinued     500 mg 100 mL/hr over 60 Minutes Intravenous Every 24 hours 08/11/2014 0849 08/25/14 1026   08/03/2014 1500   piperacillin-tazobactam (ZOSYN) IVPB 3.375 g  Status:  Discontinued     3.375 g 12.5 mL/hr over 240 Minutes Intravenous 3 times per day 08/14/2014 0849 08/25/14 1026   08/06/2014 0900  piperacillin-tazobactam (ZOSYN) IVPB 3.375 g     3.375 g 100 mL/hr over 30 Minutes Intravenous  Once 08/07/2014 0847 08/07/2014 1057   08/07/2014 0900  vancomycin (VANCOCIN) IVPB 1000 mg/200 mL premix     1,000 mg 200 mL/hr over 60 Minutes Intravenous  Once 08/15/2014 0847 08/07/2014 1119      DVT Prophylaxis: Coumadin  Code Status: DNR  Family Communication  sister [name] over the phone [phone])-  Procedures: None  CONSULTS:  cardiology  Time spent 25 minutes-Greater than 50% of this time was spent in counseling, explanation of diagnosis, planning of further management, and coordination of care.  MEDICATIONS: Scheduled Meds: . amiodarone  200 mg Per Tube BID  . bethanechol  10 mg Per Tube TID  . feeding supplement (OSMOLITE 1.5 CAL)  237 mL Per Tube 5 X Daily  . ferrous sulfate  300 mg Per Tube QAC lunch  . furosemide  40 mg Oral Daily  . guaifenesin  400 mg Per Tube Q8H  . levothyroxine  50 mcg Per Tube QAC breakfast  . lisinopril  2.5 mg Per Tube Daily  . metoprolol tartrate  12.5 mg Oral BID  . potassium chloride  40 mEq Per Tube BID  . Warfarin - Pharmacist Dosing Inpatient   Does not apply q1800   Continuous Infusions:  PRN Meds:.acetaminophen **OR** acetaminophen, HYDROcodone-acetaminophen, ipratropium, levalbuterol, ondansetron **OR** ondansetron (ZOFRAN) IV, traMADol    PHYSICAL EXAM: Vital signs in last 24 hours:  Filed Vitals:   08/25/14 1340 08/25/14 2005 08/25/14 2042 08/26/14 0455  BP: 121/89 115/76  138/76  Pulse: 109 115  95  Temp: 97.7 F (36.5 C) 97.8 F (36.6 C)  97.2 F (36.2 C)  TempSrc: Oral Oral  Oral  Resp: 18 18  18   Height: 5' 2.5" (1.588 m)     Weight: 48.4 kg (106 lb 11.2 oz)   47.4 kg (104 lb 8 oz)  SpO2: 100% 100% 93% 99%    Weight change: 1 kg  (2 lb 3.3 oz) Filed Weights   08/25/14 0500 08/25/14 1340 08/26/14 0455  Weight: 47.4 kg (104 lb 8 oz) 48.4 kg (106 lb 11.2 oz) 47.4 kg (104 lb 8 oz)   Body mass index is 18.8 kg/(m^2).   Gen Exam: Awake and alert-pleasantly confused Neck: Supple, No JVD.   Chest: B/L Clear anteriorly CVS: S1 S2 Regular, no murmurs.  Abdomen: soft, BS +, non tender, non distended.  Extremities: no edema, lower extremities warm to touch.  Intake/Output from previous day:  Intake/Output Summary (Last 24 hours) at 08/26/14 1310 Last data filed at 08/26/14 1018  Gross per 24 hour  Intake    474 ml  Output      0 ml  Net    474 ml     LAB RESULTS: CBC  Recent Labs Lab Sep 09, 2014 0600 08/23/14 0030 08/24/14 0243 08/24/14 1138 08/25/14 0300  WBC 6.0 4.8 7.5 7.0 7.6  HGB 12.6 13.0 13.3 12.6 13.4  HCT 39.5 39.2 41.3 39.1 42.8  PLT 206 208 217 214 234  MCV 91.0 90.3 90.8 91.4 92.8  MCH 29.0 30.0 29.2 29.4 29.1  MCHC 31.9 33.2 32.2 32.2 31.3  RDW 16.0* 15.9* 16.3* 16.4* 16.7*  LYMPHSABS 0.2*  --   --  0.2*  --   MONOABS 0.5  --   --  0.8  --   EOSABS 0.0  --   --  0.0  --   BASOSABS 0.0  --   --  0.0  --     Chemistries   Recent Labs Lab 09-Sep-2014 0930 08/23/14 0030 08/24/14 0243 08/24/14 1138 08/25/14 0300  NA 132* 133* 135 136 140  K 4.4 3.2* 3.4* 3.7 4.0  CL 93* 90* 89* 91* 91*  CO2 25 26 34* 32 37*  GLUCOSE 135* 206* 123* 177* 105*  BUN 35* 32* 35* 36* 37*  CREATININE 0.65 0.91 0.78 0.81 0.80  CALCIUM 9.6 9.1 9.1 8.8* 9.1  MG  --   --   --  2.2 2.4    CBG:  Recent Labs Lab 08/25/14 1625 08/25/14 1939 08/25/14 2343 08/26/14 0455 08/26/14 1109  GLUCAP 135* 106* 159* 199* 182*    GFR Estimated Creatinine Clearance: 39.9 mL/min (by C-G formula based on Cr of 0.8).  Coagulation profile  Recent Labs Lab 09-09-2014 0600 08/23/14 0030 08/24/14 0243 08/25/14 0300 08/26/14 0356  INR 3.01* 2.82* 3.97* 4.14* 4.01*    Cardiac Enzymes  Recent Labs Lab  09-09-2014 1255 09/09/2014 1823 08/23/14 0030  TROPONINI 0.04* 0.04* 0.03    Invalid input(s): POCBNP No results for input(s): DDIMER in the last 72 hours. No results for input(s): HGBA1C in the last 72 hours. No results for input(s): CHOL, HDL, LDLCALC, TRIG, CHOLHDL, LDLDIRECT in the last 72 hours. No results for input(s): TSH, T4TOTAL, T3FREE, THYROIDAB in the last 72 hours.  Invalid input(s): FREET3 No results for input(s): VITAMINB12, FOLATE, FERRITIN, TIBC, IRON, RETICCTPCT in the last 72 hours. No  results for input(s): LIPASE, AMYLASE in the last 72 hours.  Urine Studies No results for input(s): UHGB, CRYS in the last 72 hours.  Invalid input(s): UACOL, UAPR, USPG, UPH, UTP, UGL, UKET, UBIL, UNIT, UROB, ULEU, UEPI, UWBC, URBC, UBAC, CAST, UCOM, BILUA  MICROBIOLOGY: Recent Results (from the past 240 hour(s))  Blood culture (routine x 2)     Status: None (Preliminary result)   Collection Time: 10-21-14  6:00 AM  Result Value Ref Range Status   Specimen Description BLOOD RIGHT ANTECUBITAL  Final   Special Requests BOTTLES DRAWN AEROBIC AND ANAEROBIC 10ML  Final   Culture NO GROWTH 4 DAYS  Final   Report Status PENDING  Incomplete  Blood culture (routine x 2)     Status: None (Preliminary result)   Collection Time: 10-21-14  9:20 AM  Result Value Ref Range Status   Specimen Description BLOOD BLOOD RIGHT FOREARM  Final   Special Requests BOTTLES DRAWN AEROBIC AND ANAEROBIC 4ML  Final   Culture NO GROWTH 4 DAYS  Final   Report Status PENDING  Incomplete  MRSA PCR Screening     Status: None   Collection Time: 10-21-14 12:30 PM  Result Value Ref Range Status   MRSA by PCR NEGATIVE NEGATIVE Final    Comment:        The GeneXpert MRSA Assay (FDA approved for NASAL specimens only), is one component of a comprehensive MRSA colonization surveillance program. It is not intended to diagnose MRSA infection nor to guide or monitor treatment for MRSA infections.     RADIOLOGY  STUDIES/RESULTS: Dg Chest Port 1 View  December 11, 2014   CLINICAL DATA:  Initial evaluation for acute shortness of breath.  EXAM: PORTABLE CHEST - 1 VIEW  COMPARISON:  Prior radiograph from 08/12/2013  FINDINGS: Cardiomegaly again seen, grossly stable. Mediastinal silhouette within normal limits. Atheromatous plaque present within the intrathoracic aorta. Metallic watch overlies of the upper abdomen.  Lungs are normally inflated. Moderate bilateral pleural effusions are present, left greater than right. There is diffuse pulmonary vascular congestion with indistinctness of the interstitial markings, suggesting moderate diffuse pulmonary edema. No definite focal infiltrates. No pneumothorax.  No acute osseus abnormality. Surgical clips overlie the right axilla.  IMPRESSION: Cardiomegaly with moderate diffuse pulmonary edema and bilateral pleural effusions, left greater than right.   Electronically Signed   By: Rise MuBenjamin  McClintock M.D.   On: 0October 17, 2016 06:53    Jeoffrey MassedGHIMIRE,SHANKER, MD  [facility] Pager:[phone]  If 7PM-7AM, please contact night-coverage [email or web address] Password [email or web address] 08/26/2014, 1:10 PM   LOS: 4 days

## 2014-08-26 NOTE — Progress Notes (Signed)
ANTICOAGULATION CONSULT NOTE - Follow Up Consult  Pharmacy Consult for warfarin Indication: atrial fibrillation  Allergies  Allergen Reactions  . Morphine And Related Nausea And Vomiting    Patient Measurements: Height: 5' 2.5" (158.8 cm) Weight: 104 lb 8 oz (47.4 kg) IBW/kg (Calculated) : 51.25  Vital Signs: Temp: 97.2 F (36.2 C) (07/02 0455) Temp Source: Oral (07/02 0455) BP: 138/76 mmHg (07/02 0455) Pulse Rate: 95 (07/02 0455)  Labs:  Recent Labs  08/24/14 0243 08/24/14 1138 08/25/14 0300 08/26/14 0356  HGB 13.3 12.6 13.4  --   HCT 41.3 39.1 42.8  --   PLT 217 214 234  --   LABPROT 37.8*  --  39.0* 38.0*  INR 3.97*  --  4.14* 4.01*  CREATININE 0.78 0.81 0.80  --     Estimated Creatinine Clearance: 39.9 mL/min (by C-G formula based on Cr of 0.8).  Assessment: [age] yof continues on chronic coumadin for afib. INR continues to be supratherapeutic at 4. CBC was WNL yesterday and no bleeding noted. Of note, pt was started on amiodarone 6/28. This interacts with warfarin and may cause and increased INR. Will require close monitoring. If pt to be discharged on amiodarone, her home regimen will likely need adjusting.    Will continue to hold warfarin until back within therapeutic range.  Antibiotics stopped.  Goal of Therapy:  INR 2-3 Monitor platelets by anticoagulation protocol: Yes   Plan:  - No coumadin tonight - Daily INR  Sheppard CoilFrank Brigitte Soderberg PharmD., BCPS Clinical Pharmacist Pager [phone] 08/26/2014 11:19 AM

## 2014-08-27 LAB — CULTURE, BLOOD (ROUTINE X 2)
CULTURE: NO GROWTH
Culture: NO GROWTH

## 2014-08-27 LAB — GLUCOSE, CAPILLARY
GLUCOSE-CAPILLARY: 129 mg/dL — AB (ref 65–99)
Glucose-Capillary: 123 mg/dL — ABNORMAL HIGH (ref 65–99)
Glucose-Capillary: 140 mg/dL — ABNORMAL HIGH (ref 65–99)
Glucose-Capillary: 221 mg/dL — ABNORMAL HIGH (ref 65–99)

## 2014-08-27 LAB — PROTIME-INR
INR: 2.59 — AB (ref 0.00–1.49)
PROTHROMBIN TIME: 27.4 s — AB (ref 11.6–15.2)

## 2014-08-27 MED ORDER — INSULIN ASPART 100 UNIT/ML ~~LOC~~ SOLN
0.0000 [IU] | Freq: Three times a day (TID) | SUBCUTANEOUS | Status: DC
  Administered 2014-08-27 – 2014-08-28 (×4): 2 [IU] via SUBCUTANEOUS
  Administered 2014-08-28: 3 [IU] via SUBCUTANEOUS

## 2014-08-27 MED ORDER — INSULIN ASPART 100 UNIT/ML ~~LOC~~ SOLN
0.0000 [IU] | Freq: Every day | SUBCUTANEOUS | Status: DC
  Administered 2014-08-27: 2 [IU] via SUBCUTANEOUS

## 2014-08-27 MED ORDER — METOPROLOL TARTRATE 1 MG/ML IV SOLN: 2.5000 mg | INTRAVENOUS | Status: DC | PRN

## 2014-08-27 MED ORDER — METOPROLOL TARTRATE 25 MG PO TABS
25.0000 mg | ORAL_TABLET | Freq: Two times a day (BID) | ORAL | Status: DC
  Administered 2014-08-27 (×2): 25 mg
  Filled 2014-08-27 (×4): qty 1

## 2014-08-27 MED ORDER — WARFARIN SODIUM 2 MG PO TABS
2.0000 mg | ORAL_TABLET | Freq: Once | ORAL | Status: AC
  Administered 2014-08-27: 2 mg via ORAL
  Filled 2014-08-27: qty 1

## 2014-08-27 MED ORDER — METOPROLOL TARTRATE 25 MG PO TABS: 25.0000 mg | ORAL_TABLET | Freq: Two times a day (BID) | ORAL | Status: DC

## 2014-08-27 MED ORDER — FUROSEMIDE 40 MG PO TABS
40.0000 mg | ORAL_TABLET | Freq: Every day | ORAL | Status: DC
  Administered 2014-08-27 – 2014-08-31 (×4): 40 mg
  Filled 2014-08-27 (×5): qty 1

## 2014-08-27 NOTE — Progress Notes (Addendum)
ANTICOAGULATION CONSULT NOTE - Follow Up Consult  Pharmacy Consult for warfarin Indication: atrial fibrillation  Allergies  Allergen Reactions  . Morphine And Related Nausea And Vomiting    Patient Measurements: Height: 5' 2.5" (158.8 cm) Weight: 103 lb 2.8 oz (46.8 kg) IBW/kg (Calculated) : 51.25  Vital Signs: Temp: 98.2 F (36.8 C) (07/03 0436) Temp Source: Axillary (07/03 0436) BP: 144/84 mmHg (07/03 0436) Pulse Rate: 122 (07/03 0436)  Labs:  Recent Labs  08/24/14 1138 08/25/14 0300 08/26/14 0356 08/27/14 0500  HGB 12.6 13.4  --   --   HCT 39.1 42.8  --   --   PLT 214 234  --   --   LABPROT  --  39.0* 38.0* 27.4*  INR  --  4.14* 4.01* 2.59*  CREATININE 0.81 0.80  --   --     Estimated Creatinine Clearance: 39.4 mL/min (by C-G formula based on Cr of 0.8).  Assessment: [age] yof continues on chronic coumadin for afib.   INR drifted down overnight to 2.59 this am. CBC was WNL 7/1 and no bleeding noted. Of note, pt was started on amiodarone 6/28. This interacts with warfarin and may cause increased coumadin sensitivity. Will require close monitoring. If pt to be discharged on amiodarone, her home regimen will likely need adjusting.    Will give low dose of coumadin tonight but a reasonable regimen with the addition of amio would be 3.5 mg daily given her previous regimen and follow INR trend closely at snf.  Antibiotics completed.  Goal of Therapy:  INR 2-3 Monitor platelets by anticoagulation protocol: Yes   Plan:  - Warfarin 2mg  tonight - Daily INR  Sheppard CoilFrank Kainat Pizana PharmD., BCPS Clinical Pharmacist Pager [phone] 08/27/2014 8:56 AM

## 2014-08-27 NOTE — Progress Notes (Addendum)
PATIENT DETAILS Name: Tamara PouchMary Ellen Bailey Age: 81 y.o. Sex: female Date of Birth: 10/10/1929 Admit Date: Jan 22, 2015 Admitting Physician Ripudeep Jenna LuoK Rai, MD ZOX:[id number],WJXPCP:[id number],HAL Maisie FusHOMAS, MD  Subjective: Lying comfortably. Denies SOB. Heart rate up today  Assessment/Plan: Principal Problem: Sepsis due to Aspiration pneumonitis versus HCAP:resolved. Completed IV Abx, felt to have more of acute CHF rather than PNA.  Active Problems: Acute respiratory failure:felt to be a combination of PNA and acute systolic CHF. Titrate off O2 as tolerated.Felt to be at risk for aspiration-will need oral care. Already NPO-feeds through peg tube. Family aware of poor overall prognoses.   Acute on Chronic Severe Systolic BJY:NWGNFAOZCHF:improved and now compensated. Weight down to 103 lb (119 lb on admit). Cardiology consulted. Now transitioned to oral Lasix. Continue beta blocker and lisinopril.  Atrial fibrillation with RVR:EF-25% to 30% per Echo June 2016.CHad2Vasc2 score 5 - amiodarone per Cardiology. Will increase metoprolol to 25 mg twice a day. [name].Spoke with sister Bayard Beaverva Moody over the phone [phone]) on 08/26/14-explained risk vs benefits of anticoagulation-family accepting risk-especially fall risk-therefore will continue. Will need to readdressed in the near future-especially if patient sustains another fall.  History of dysphagia:PEG tube in place.On bolus feeds.  Hypothyroidism:Continue Synthroid  Dementia: appears close to usual baseline.   Right knee pain:Denies pain-no swelling or erythema seen- follow clinically for now   Hx of CVA: on coumadin-hx of Afib  Disposition: Remain inpatient-SNF tomorrow-once heart rate better   Antimicrobial agents  See below  Anti-infectives    Start     Dose/Rate Route Frequency Ordered Stop   08/23/14 1000  vancomycin (VANCOCIN) 500 mg in sodium chloride 0.9 % 100 mL IVPB  Status:  Discontinued     500  mg 100 mL/hr over 60 Minutes Intravenous Every 24 hours 2015/01/28 0849 08/25/14 1026   2015/01/28 1500  piperacillin-tazobactam (ZOSYN) IVPB 3.375 g  Status:  Discontinued     3.375 g 12.5 mL/hr over 240 Minutes Intravenous 3 times per day 2015/01/28 0849 08/25/14 1026   2015/01/28 0900  piperacillin-tazobactam (ZOSYN) IVPB 3.375 g     3.375 g 100 mL/hr over 30 Minutes Intravenous  Once 2015/01/28 0847 2015/01/28 1057   2015/01/28 0900  vancomycin (VANCOCIN) IVPB 1000 mg/200 mL premix     1,000 mg 200 mL/hr over 60 Minutes Intravenous  Once 2015/01/28 0847 2015/01/28 1119      DVT Prophylaxis: Coumadin  Code Status: DNR  Family Communication  sister Bayard Beaverva Moody over the phone [phone])- on 7/2-none at bedside today  Procedures: None  CONSULTS:  cardiology  Time spent 25 minutes-Greater than 50% of this time was spent in counseling, explanation of diagnosis, planning of further management, and coordination of care.  MEDICATIONS: Scheduled Meds: . amiodarone  200 mg Per Tube BID  . bethanechol  10 mg Per Tube TID  . feeding supplement (OSMOLITE 1.5 CAL)  237 mL Per Tube 5 X Daily  . ferrous sulfate  300 mg Per Tube QAC lunch  . furosemide  40 mg Per Tube Daily  . guaifenesin  400 mg Per Tube Q8H  . insulin aspart  0-15 Units Subcutaneous TID WC  . insulin aspart  0-5 Units Subcutaneous QHS  . levothyroxine  50 mcg Per Tube QAC breakfast  . lisinopril  2.5 mg Per Tube Daily  . metoprolol tartrate  25 mg Per Tube BID  . potassium chloride  40 mEq Per Tube  BID  . warfarin  2 mg Oral ONCE-1800  . Warfarin - Pharmacist Dosing Inpatient   Does not apply q1800   Continuous Infusions:  PRN Meds:.acetaminophen **OR** acetaminophen, HYDROcodone-acetaminophen, ipratropium, levalbuterol, ondansetron **OR** ondansetron (ZOFRAN) IV, traMADol    PHYSICAL EXAM: Vital signs in last 24 hours: Filed Vitals:   08/26/14 0455 08/26/14 1445 08/26/14 2001 08/27/14 0436  BP: 138/76 129/82 135/84  144/84  Pulse: 95 112 99 122  Temp: 97.2 F (36.2 C) 97.8 F (36.6 C) 97.6 F (36.4 C) 98.2 F (36.8 C)  TempSrc: Oral Oral Oral Axillary  Resp: Height:      Weight: 47.4 kg (104 lb 8 oz)   46.8 kg (103 lb 2.8 oz)  SpO2: 99% 100% 99% 98%    Weight change: -1.6 kg (-3 lb 8.4 oz) Filed Weights   08/25/14 1340 08/26/14 0455 08/27/14 0436  Weight: 48.4 kg (106 lb 11.2 oz) 47.4 kg (104 lb 8 oz) 46.8 kg (103 lb 2.8 oz)   Body mass index is 18.56 kg/(m^2).   Gen Exam: Awake and alert-pleasantly confused-answers questions appropriately  Neck: Supple, No JVD.   Chest: B/L Clear anteriorly CVS: S1 S2 irregular, no murmurs.  Abdomen: soft, BS +, non tender, non distended.  Extremities: no edema, lower extremities warm to touch.  Intake/Output from previous day:  Intake/Output Summary (Last 24 hours) at 08/27/14 1114 Last data filed at 08/27/14 0830  Gross per 24 hour  Intake      0 ml  Output      0 ml  Net      0 ml     LAB RESULTS: CBC  Recent Labs Lab August 30, 2014 0600 08/23/14 0030 08/24/14 0243 08/24/14 1138 08/25/14 0300  WBC 6.0 4.8 7.5 7.0 7.6  HGB 12.6 13.0 13.3 12.6 13.4  HCT 39.5 39.2 41.3 39.1 42.8  PLT 206 208 217 214 234  MCV 91.0 90.3 90.8 91.4 92.8  MCH 29.0 30.0 29.2 29.4 29.1  MCHC 31.9 33.2 32.2 32.2 31.3  RDW 16.0* 15.9* 16.3* 16.4* 16.7*  LYMPHSABS 0.2*  --   --  0.2*  --   MONOABS 0.5  --   --  0.8  --   EOSABS 0.0  --   --  0.0  --   BASOSABS 0.0  --   --  0.0  --     Chemistries   Recent Labs Lab 08/30/14 0930 08/23/14 0030 08/24/14 0243 08/24/14 1138 08/25/14 0300  NA 132* 133* 135 136 140  K 4.4 3.2* 3.4* 3.7 4.0  CL 93* 90* 89* 91* 91*  CO2 25 26 34* 32 37*  GLUCOSE 135* 206* 123* 177* 105*  BUN 35* 32* 35* 36* 37*  CREATININE 0.65 0.91 0.78 0.81 0.80  CALCIUM 9.6 9.1 9.1 8.8* 9.1  MG  --   --   --  2.2 2.4    CBG:  Recent Labs Lab 08/26/14 1109 08/26/14 1613 08/26/14 1918 08/27/14 0014 08/27/14 0405   GLUCAP 182* 142* 228* 221* 140*    GFR Estimated Creatinine Clearance: 39.4 mL/min (by C-G formula based on Cr of 0.8).  Coagulation profile  Recent Labs Lab 08/23/14 0030 08/24/14 0243 08/25/14 0300 08/26/14 0356 08/27/14 0500  INR 2.82* 3.97* 4.14* 4.01* 2.59*    Cardiac Enzymes  Recent Labs Lab 2014/08/30 1255 August 30, 2014 1823 08/23/14 0030  TROPONINI 0.04* 0.04* 0.03    Invalid input(s): POCBNP No results for input(s): DDIMER in the last 72 hours.  No results for input(s): HGBA1C in the last 72 hours. No results for input(s): CHOL, HDL, LDLCALC, TRIG, CHOLHDL, LDLDIRECT in the last 72 hours. No results for input(s): TSH, T4TOTAL, T3FREE, THYROIDAB in the last 72 hours.  Invalid input(s): FREET3 No results for input(s): VITAMINB12, FOLATE, FERRITIN, TIBC, IRON, RETICCTPCT in the last 72 hours. No results for input(s): LIPASE, AMYLASE in the last 72 hours.  Urine Studies No results for input(s): UHGB, CRYS in the last 72 hours.  Invalid input(s): UACOL, UAPR, USPG, UPH, UTP, UGL, UKET, UBIL, UNIT, UROB, ULEU, UEPI, UWBC, URBC, UBAC, CAST, UCOM, BILUA  MICROBIOLOGY: Recent Results (from the past 240 hour(s))  Blood culture (routine x 2)     Status: None (Preliminary result)   Collection Time: 09-05-14  6:00 AM  Result Value Ref Range Status   Specimen Description BLOOD RIGHT ANTECUBITAL  Final   Special Requests BOTTLES DRAWN AEROBIC AND ANAEROBIC  Final   Culture NO GROWTH 4 DAYS  Final   Report Status PENDING  Incomplete  Blood culture (routine x 2)     Status: None (Preliminary result)   Collection Time: 09-05-2014  9:20 AM  Result Value Ref Range Status   Specimen Description BLOOD BLOOD RIGHT FOREARM  Final   Special Requests BOTTLES DRAWN AEROBIC AND ANAEROBIC  Final   Culture NO GROWTH 4 DAYS  Final   Report Status PENDING  Incomplete  MRSA PCR Screening     Status: None   Collection Time: 09/05/2014 12:30 PM  Result Value Ref Range Status   MRSA  by PCR NEGATIVE NEGATIVE Final    Comment:        The GeneXpert MRSA Assay (FDA approved for NASAL specimens only), is one component of a comprehensive MRSA colonization surveillance program. It is not intended to diagnose MRSA infection nor to guide or monitor treatment for MRSA infections.     RADIOLOGY STUDIES/RESULTS: Dg Chest Port 1 View  09-05-14   CLINICAL DATA:  Initial evaluation for acute shortness of breath.  EXAM: PORTABLE CHEST - 1 VIEW  COMPARISON:  Prior radiograph from 08/12/2013  FINDINGS: Cardiomegaly again seen, grossly stable. Mediastinal silhouette within normal limits. Atheromatous plaque present within the intrathoracic aorta. Metallic watch overlies of the upper abdomen.  Lungs are normally inflated. Moderate bilateral pleural effusions are present, left greater than right. There is diffuse pulmonary vascular congestion with indistinctness of the interstitial markings, suggesting moderate diffuse pulmonary edema. No definite focal infiltrates. No pneumothorax.  No acute osseus abnormality. Surgical clips overlie the right axilla.  IMPRESSION: Cardiomegaly with moderate diffuse pulmonary edema and bilateral pleural effusions, left greater than right.   Electronically Signed   By: Rise Mu M.D.   On: 2014-09-05 06:53    Jeoffrey Massed, MD  [facility] Pager:[phone]  If 7PM-7AM, please contact night-coverage [email or web address] Password [email or web address] 08/27/2014, 11:14 AM   LOS: 5 days

## 2014-08-27 NOTE — Progress Notes (Signed)
Dr. Jerral RalphGhimire indicates patient's heart rate is up and will hold on dc until tomorrow- CSW has updated SNF and will have CSW covering tomorrow to assist further with dc.   Reece LevyJanet Zaion Hreha, MSW, [facility] CenterCSWA Weekend [phone]

## 2014-08-28 LAB — GLUCOSE, CAPILLARY
GLUCOSE-CAPILLARY: 227 mg/dL — AB (ref 65–99)
Glucose-Capillary: 118 mg/dL — ABNORMAL HIGH (ref 65–99)
Glucose-Capillary: 134 mg/dL — ABNORMAL HIGH (ref 65–99)
Glucose-Capillary: 147 mg/dL — ABNORMAL HIGH (ref 65–99)
Glucose-Capillary: 148 mg/dL — ABNORMAL HIGH (ref 65–99)
Glucose-Capillary: 148 mg/dL — ABNORMAL HIGH (ref 65–99)
Glucose-Capillary: 165 mg/dL — ABNORMAL HIGH (ref 65–99)

## 2014-08-28 LAB — PROTIME-INR
INR: 1.99 — ABNORMAL HIGH (ref 0.00–1.49)
Prothrombin Time: 22.5 seconds — ABNORMAL HIGH (ref 11.6–15.2)

## 2014-08-28 MED ORDER — METOPROLOL TARTRATE 50 MG PO TABS
50.0000 mg | ORAL_TABLET | Freq: Two times a day (BID) | ORAL | Status: DC
  Administered 2014-08-28 (×2): 50 mg
  Filled 2014-08-28 (×4): qty 1

## 2014-08-28 MED ORDER — WARFARIN SODIUM 3 MG PO TABS
3.5000 mg | ORAL_TABLET | Freq: Every day | ORAL | Status: DC
  Administered 2014-08-28: 3.5 mg via ORAL
  Filled 2014-08-28 (×2): qty 1

## 2014-08-28 NOTE — Progress Notes (Signed)
ANTICOAGULATION CONSULT NOTE - Follow Up Consult  Pharmacy Consult for warfarin Indication: atrial fibrillation  Allergies  Allergen Reactions  . Morphine And Related Nausea And Vomiting    Patient Measurements: Height: 5' 2.5" (158.8 cm) Weight: 98 lb 12.3 oz (44.8 kg) IBW/kg (Calculated) : 51.25  Vital Signs: Temp: 98.1 F (36.7 C) (07/04 0642) Temp Source: Oral (07/04 0642) BP: 120/90 mmHg (07/04 0642) Pulse Rate: 116 (07/04 0642)  Labs:  Recent Labs  08/26/14 0356 08/27/14 0500 08/28/14 0320  LABPROT 38.0* 27.4* 22.5*  INR 4.01* 2.59* 1.99*    Estimated Creatinine Clearance: 37.7 mL/min (by C-G formula based on Cr of 0.8).  Assessment: [age] yof continues on chronic coumadin for afib.   INR drifted down overnight to 2.59 this am. CBC was WNL 7/1 and no bleeding noted. Of note, pt was started on amiodarone 6/28. This interacts with warfarin and may cause increased coumadin sensitivity. Will require close monitoring. If pt to be discharged on amiodarone, her home regimen will likely need adjusting.    Will give low dose of coumadin tonight but a reasonable regimen with the addition of amio would be 3.5 mg daily given her previous regimen and follow INR trend closely at snf.  Antibiotics completed.  Goal of Therapy:  INR 2-3 Monitor platelets by anticoagulation protocol: Yes   Plan:  - Start Coumadin 3.5 mg po daily - Daily INR  Thank you Okey RegalLisa Jenasis Straley, PharmD [phone]  08/28/2014 8:28 AM

## 2014-08-28 NOTE — Progress Notes (Signed)
Patient discussed with Dr Jerral RalphGhimire who [facility].  Will follow up with the SNF in the morning once d/c is confirmed.  Lorri Frederickonna T. Jaci LazierCrowder, KentuckyLCSW [phone]

## 2014-08-28 NOTE — Care Management (Signed)
Important Message  Patient Details  Name: Tamara PouchMary Ellen Bailey MRN: [id number] Date of Birth: 12/25/1929   Medicare Important Message Given:  Yes-second notification given    Bernadette HoitShoffner, Shadara Lopez Coleman 08/28/2014, 9:40 AM

## 2014-08-28 NOTE — Progress Notes (Signed)
PATIENT DETAILS Name: Tamara Bailey Age: 81 y.o. Sex: female Date of Birth: [birth year] Admit Date: 2014-09-16 Admitting Physician Ripudeep Jenna Luo, MD NWG:[id number],VHQ Maisie Fus, MD  Subjective: Lying comfortably.Denies SOB-lying flat-heart rate still up-but only in the low 100's today  Assessment/Plan: Principal Problem: Sepsis due to Aspiration pneumonitis versus HCAP:resolved. Completed IV Abx, felt to have more of acute CHF rather than PNA.Suspect will be always are risk for Aspiration PNA-as has chronic dysphagia. Family is aware.   Active Problems: Acute respiratory failure:felt to be a combination of PNA and acute systolic CHF. Titrate off O2 as tolerated.Felt to be at risk for aspiration-will need oral care. Already NPO-feeds through peg tube. Family aware of poor overall prognoses.   Acute on Chronic Severe Systolic ION:GEXBMWUX and now compensated. Weight down to 98 lb (119 lb on admit). Cardiology consulted. Now transitioned to oral Lasix. Continue beta blocker and lisinopril.  Atrial fibrillation with RVR:EF-25% to 30% per Echo June 2016.CHad2Vasc2 score 5 - amiodarone started per Cardiology. Heart rate still up-but slightly better-will increase metoprolol to 50 mg twice a day. Spoke with Dr Janeece Riggers advised another 24 hours of monitoring to see if the heart rate will stay in the low 100's range. [name].Spoke with sister [name] over the phone [phone]) on 08/26/14-explained risk vs benefits of anticoagulation-family accepting risk-especially fall risk-therefore will continue. Will need to readdressed in the near future-especially if patient sustains another fall.  History of dysphagia:PEG tube in place.On bolus feeds.At risk of aspiration of secretions. Continue oral care.  Hypothyroidism:Continue Synthroid   Dementia: appears close to usual baseline. [name].  Right  knee pain:Denies pain-no swelling or erythema seen- follow clinically for now   Hx of CVA: on coumadin-hx of Afib  Disposition: Remain inpatient-SNF tomorrow-once heart rate better   Antimicrobial agents  See below  Anti-infectives    Start     Dose/Rate Route Frequency Ordered Stop   08/23/14 1000  vancomycin (VANCOCIN) 500 mg in sodium chloride 0.9 % 100 mL IVPB  Status:  Discontinued     500 mg 100 mL/hr over 60 Minutes Intravenous Every 24 hours Sep 16, 2014 0849 08/25/14 1026   09/16/14 1500  piperacillin-tazobactam (ZOSYN) IVPB 3.375 g  Status:  Discontinued     3.375 g 12.5 mL/hr over 240 Minutes Intravenous 3 times per day 09/16/14 0849 08/25/14 1026   09/16/2014 0900  piperacillin-tazobactam (ZOSYN) IVPB 3.375 g     3.375 g 100 mL/hr over 30 Minutes Intravenous  Once 2014-09-16 0847 2014-09-16 1057   16-Sep-2014 0900  vancomycin (VANCOCIN) IVPB 1000 mg/200 mL premix     1,000 mg 200 mL/hr over 60 Minutes Intravenous  Once 09/16/2014 0847 09/16/2014 1119      DVT Prophylaxis: Coumadin  Code Status: DNR  Family Communication  sister [name] over the phone [phone])- on 7/2-none at bedside today  Procedures: None  CONSULTS:  cardiology  Time spent 25 minutes-Greater than 50% of this time was spent in counseling, explanation of diagnosis, planning of further management, and coordination of care.  MEDICATIONS: Scheduled Meds: . amiodarone  200 mg Per Tube BID  . bethanechol  10 mg Per Tube TID  . feeding supplement (OSMOLITE 1.5 CAL)  237 mL Per Tube 5 X Daily  . ferrous sulfate  300 mg Per Tube QAC lunch  . furosemide  40 mg Per Tube Daily  . guaifenesin  400 mg Per Tube Q8H  . insulin aspart  0-15 Units Subcutaneous TID WC  . insulin aspart  0-5 Units Subcutaneous QHS  . levothyroxine  50 mcg Per Tube QAC breakfast  . lisinopril  2.5 mg Per Tube Daily  . metoprolol tartrate  50 mg Per Tube BID  . potassium chloride  40 mEq Per Tube BID  . warfarin  3.5 mg Oral  q1800  . Warfarin - Pharmacist Dosing Inpatient   Does not apply q1800   Continuous Infusions:  PRN Meds:.acetaminophen **OR** acetaminophen, HYDROcodone-acetaminophen, ipratropium, levalbuterol, metoprolol, ondansetron **OR** ondansetron (ZOFRAN) IV, traMADol    PHYSICAL EXAM: Vital signs in last 24 hours: Filed Vitals:   08/27/14 1419 08/27/14 2048 08/28/14 0251 08/28/14 0642  BP: 113/80 133/95 135/93 120/90  Pulse: 119 124 112 116  Temp: 98.5 F (36.9 C) 98.4 F (36.9 C) 98.3 F (36.8 C) 98.1 F (36.7 C)  TempSrc: Oral Oral Oral Oral  Resp: 20 20 18 17   Height:      Weight:    44.8 kg (98 lb 12.3 oz)  SpO2: 100% 100% 100% 99%    Weight change: -2 kg (-4 lb 6.5 oz) Filed Weights   08/26/14 0455 08/27/14 0436 08/28/14 0642  Weight: 47.4 kg (104 lb 8 oz) 46.8 kg (103 lb 2.8 oz) 44.8 kg (98 lb 12.3 oz)   Body mass index is 17.77 kg/(m^2).   Gen Exam: Awake and alert-pleasantly confused-answers questions appropriately and follows commands. Neck: Supple, No JVD.   Chest: B/L Clear anteriorly CVS: S1 S2 irregular, no murmurs.  Abdomen: soft, BS +, non tender, non distended.  Extremities: no edema, lower extremities warm to touch.  Intake/Output from previous day:  Intake/Output Summary (Last 24 hours) at 08/28/14 1232 Last data filed at 08/28/14 1100  Gross per 24 hour  Intake    312 ml  Output      0 ml  Net    312 ml     LAB RESULTS: CBC  Recent Labs Lab 07/28/2014 0600 08/23/14 0030 08/24/14 0243 08/24/14 1138 08/25/14 0300  WBC 6.0 4.8 7.5 7.0 7.6  HGB 12.6 13.0 13.3 12.6 13.4  HCT 39.5 39.2 41.3 39.1 42.8  PLT 206 208 217 214 234  MCV 91.0 90.3 90.8 91.4 92.8  MCH 29.0 30.0 29.2 29.4 29.1  MCHC 31.9 33.2 32.2 32.2 31.3  RDW 16.0* 15.9* 16.3* 16.4* 16.7*  LYMPHSABS 0.2*  --   --  0.2*  --   MONOABS 0.5  --   --  0.8  --   EOSABS 0.0  --   --  0.0  --   BASOSABS 0.0  --   --  0.0  --     Chemistries   Recent Labs Lab 08/15/2014 0930  08/23/14 0030 08/24/14 0243 08/24/14 1138 08/25/14 0300  NA 132* 133* 135 136 140  K 4.4 3.2* 3.4* 3.7 4.0  CL 93* 90* 89* 91* 91*  CO2 25 26 34* 32 37*  GLUCOSE 135* 206* 123* 177* 105*  BUN 35* 32* 35* 36* 37*  CREATININE 0.65 0.91 0.78 0.81 0.80  CALCIUM 9.6 9.1 9.1 8.8* 9.1  MG  --   --   --  2.2 2.4    CBG:  Recent Labs Lab 08/27/14 2355 08/28/14 0110 08/28/14 0641 08/28/14 0817 08/28/14 1144  GLUCAP 148* 227* 134* 118* 147*    GFR Estimated Creatinine Clearance: 37.7 mL/min (by C-G formula based on Cr of 0.8).  Coagulation profile  Recent Labs Lab 08/24/14 0243 08/25/14 0300 08/26/14 0356 08/27/14 0500 08/28/14 0320  INR 3.97* 4.14* 4.01* 2.59* 1.99*    Cardiac Enzymes  Recent Labs Lab 08/19/2014 1255 08/21/2014 1823 08/23/14 0030  TROPONINI 0.04* 0.04* 0.03    Invalid input(s): POCBNP No results for input(s): DDIMER in the last 72 hours. No results for input(s): HGBA1C in the last 72 hours. No results for input(s): CHOL, HDL, LDLCALC, TRIG, CHOLHDL, LDLDIRECT in the last 72 hours. No results for input(s): TSH, T4TOTAL, T3FREE, THYROIDAB in the last 72 hours.  Invalid input(s): FREET3 No results for input(s): VITAMINB12, FOLATE, FERRITIN, TIBC, IRON, RETICCTPCT in the last 72 hours. No results for input(s): LIPASE, AMYLASE in the last 72 hours.  Urine Studies No results for input(s): UHGB, CRYS in the last 72 hours.  Invalid input(s): UACOL, UAPR, USPG, UPH, UTP, UGL, UKET, UBIL, UNIT, UROB, ULEU, UEPI, UWBC, URBC, UBAC, CAST, UCOM, BILUA  MICROBIOLOGY: Recent Results (from the past 240 hour(s))  Blood culture (routine x 2)     Status: None   Collection Time: 08/10/2014  6:00 AM  Result Value Ref Range Status   Specimen Description BLOOD RIGHT ANTECUBITAL  Final   Special Requests BOTTLES DRAWN AEROBIC AND ANAEROBIC  Final   Culture NO GROWTH 5 DAYS  Final   Report Status 08/27/2014 FINAL  Final  Blood culture (routine x 2)     Status:  None   Collection Time: 07/26/2014  9:20 AM  Result Value Ref Range Status   Specimen Description BLOOD BLOOD RIGHT FOREARM  Final   Special Requests BOTTLES DRAWN AEROBIC AND ANAEROBIC  Final   Culture NO GROWTH 5 DAYS  Final   Report Status 08/27/2014 FINAL  Final  MRSA PCR Screening     Status: None   Collection Time: 07/28/2014 12:30 PM  Result Value Ref Range Status   MRSA by PCR NEGATIVE NEGATIVE Final    Comment:        The GeneXpert MRSA Assay (FDA approved for NASAL specimens only), is one component of a comprehensive MRSA colonization surveillance program. It is not intended to diagnose MRSA infection nor to guide or monitor treatment for MRSA infections.     RADIOLOGY STUDIES/RESULTS: Dg Chest Port 1 View  08/16/2014   CLINICAL DATA:  Initial evaluation for acute shortness of breath.  EXAM: PORTABLE CHEST - 1 VIEW  COMPARISON:  Prior radiograph from 08/12/2013  FINDINGS: Cardiomegaly again seen, grossly stable. Mediastinal silhouette within normal limits. Atheromatous plaque present within the intrathoracic aorta. Metallic watch overlies of the upper abdomen.  Lungs are normally inflated. Moderate bilateral pleural effusions are present, left greater than right. There is diffuse pulmonary vascular congestion with indistinctness of the interstitial markings, suggesting moderate diffuse pulmonary edema. No definite focal infiltrates. No pneumothorax.  No acute osseus abnormality. Surgical clips overlie the right axilla.  IMPRESSION: Cardiomegaly with moderate diffuse pulmonary edema and bilateral pleural effusions, left greater than right.   Electronically Signed   By: Rise Mu M.D.   On: 08/16/2014 06:53    Jeoffrey Massed, MD  [facility] Pager:[phone]  If 7PM-7AM, please contact night-coverage [email or web address] Password [email or web address] 08/28/2014, 12:32 PM   LOS: 6 days

## 2014-08-29 LAB — PROTIME-INR
INR: 1.74 — ABNORMAL HIGH (ref 0.00–1.49)
PROTHROMBIN TIME: 20.4 s — AB (ref 11.6–15.2)

## 2014-08-29 LAB — GLUCOSE, CAPILLARY
Glucose-Capillary: 122 mg/dL — ABNORMAL HIGH (ref 65–99)
Glucose-Capillary: 124 mg/dL — ABNORMAL HIGH (ref 65–99)
Glucose-Capillary: 164 mg/dL — ABNORMAL HIGH (ref 65–99)

## 2014-08-29 MED ORDER — METOPROLOL TARTRATE 50 MG PO TABS
75.0000 mg | ORAL_TABLET | Freq: Two times a day (BID) | ORAL | Status: DC
  Filled 2014-08-29 (×2): qty 1

## 2014-08-29 MED ORDER — POLYVINYL ALCOHOL 1.4 % OP SOLN
1.0000 [drp] | Freq: Four times a day (QID) | OPHTHALMIC | Status: DC | PRN
  Filled 2014-08-29: qty 15

## 2014-08-29 MED ORDER — INSULIN ASPART 100 UNIT/ML ~~LOC~~ SOLN
0.0000 [IU] | SUBCUTANEOUS | Status: DC
  Administered 2014-08-29: 3 [IU] via SUBCUTANEOUS

## 2014-08-29 MED ORDER — ATROPINE SULFATE 1 % OP SOLN
4.0000 [drp] | OPHTHALMIC | Status: DC | PRN
  Administered 2014-08-31: 4 [drp] via SUBLINGUAL
  Filled 2014-08-29: qty 2

## 2014-08-29 MED ORDER — LORAZEPAM 2 MG/ML IJ SOLN: 1.0000 mg | INTRAMUSCULAR | Status: DC | PRN

## 2014-08-29 MED ORDER — MORPHINE SULFATE (CONCENTRATE) 10 MG/0.5ML PO SOLN
5.0000 mg | ORAL | Status: DC | PRN
  Filled 2014-08-29: qty 0.5

## 2014-08-29 MED ORDER — SODIUM CHLORIDE 0.9 % IV SOLN
10.0000 mg/h | INTRAVENOUS | Status: DC
  Administered 2014-08-29: 2 mg/h via INTRAVENOUS
  Filled 2014-08-29: qty 10

## 2014-08-29 MED ORDER — MORPHINE SULFATE 2 MG/ML IJ SOLN
1.0000 mg | INTRAMUSCULAR | Status: DC | PRN
  Administered 2014-08-29: 1 mg via INTRAVENOUS
  Filled 2014-08-29: qty 1

## 2014-08-29 NOTE — Progress Notes (Signed)
UR completed 

## 2014-08-29 NOTE — Progress Notes (Signed)
ANTICOAGULATION CONSULT NOTE - Follow Up Consult  Pharmacy Consult for warfarin Indication: atrial fibrillation  Allergies  Allergen Reactions  . Morphine And Related Nausea And Vomiting    Patient Measurements: Height: 5' 2.5" (158.8 cm) Weight: 102 lb 11.8 oz (46.6 kg) IBW/kg (Calculated) : 51.25  Vital Signs: Temp: 97.6 F (36.4 C) (07/05 0424) Temp Source: Oral (07/05 0424) BP: 121/82 mmHg (07/05 0424) Pulse Rate: 108 (07/05 0424)  Labs:  Recent Labs  08/27/14 0500 08/28/14 0320 08/29/14 0527  LABPROT 27.4* 22.5* 20.4*  INR 2.59* 1.99* 1.74*    Estimated Creatinine Clearance: 39.2 mL/min (by C-G formula based on Cr of 0.8).  Assessment: Tamara Bailey is an 81 y.o. female admitted on 08/05/2014 presenting with acute dyspnea with suspected sepsis 2/2 to aspiration PNA/HCAP.  Patient with PMH of afib on coumadin PTA.  Pharmacy has been consulted to continue her warfarin for afib.    INR SUBtherapeutic at 1.74. CBC was WNL 7/1 and no bleeding noted. Of note, pt was started on amiodarone 6/28. This interacts with warfarin and may cause increased coumadin sensitivity. Will require close monitoring. If pt to be discharged on amiodarone, her home regimen will likely need adjusting.  Antibiotics completed.  Goal of Therapy:  INR 2-3 Monitor platelets by anticoagulation protocol: Yes   Plan:  - Continue coumadin 3.5 mg po daily - Daily INR - Monitor for signs and symptoms of bleeding  Red ChristiansSamson Ady Heimann, Pharm. D. Clinical Pharmacy Resident Pager: [phone]: [phone] 08/29/2014 8:48 AM

## 2014-08-29 NOTE — Progress Notes (Signed)
PATIENT DETAILS Name: Tamara PouchMary Ellen Bailey Age: 81 y.o. Sex: female Date of Birth: 01/10/1930 Admit Date: 08/14/2014 Admitting Physician Ripudeep Jenna LuoK Rai, MD ZOX:[id number],WJXPCP:STONEKING,HAL Maisie FusHOMAS, MD  Brief narrative  81 year old patient who is a resident of a skilled nursing facility history of atrial fibrillation on Coumadin, dementia, CVA, history of dysphagia on PEG tube feedings presented with shortness of breath secondary to aspiration pneumonia and acute systolic heart failure. Hospital course complicated by development of A. fib with RVR. Although initially improved, unfortunately he developed acute respiratory distress on 7/5 with evidence of pooling secretions. After discussion with multiple family members, we have transitioned to comfort care.  Subjective: Lethargic-but very tachypneic. This morning awake, following commands and speaking one-word sentences. However on later evaluation much more lethargic and tachypneic-with worsening mental status.  Assessment/Plan: Principal Problem: Sepsis due to Aspiration pneumonitis versus HCAP: Completed IV Abx, felt to have more of acute CHF rather than PNA. On 7/5, found to be significantly short of breath and tachypneic and pooling of secretions-this M.D. had a long conversation with multiple family members-please see my prior note from today-we have now transitioned to comfort measures. Family was patient be discharged to a SNF for hospice-however currently requiring IV medications for comfort. We will observe overnight, and see if we can titrate her to oral medications so that she can go back to her facility, otherwise she may need to be transitioned to residential hospice.  Active Problems: Acute respiratory failure:felt to be a combination of PNA and acute systolic CHF. Unfortunately, her respiratory failure has worsened significantly today-because of pooling of secretions-thought to have aspiration pneumonia. She is already nothing by  mouth and is on PEG tube feeding. Continue comfort measures, as needed oral care.    Acute on Chronic Severe Systolic BJY:NWGNFAOZCHF:improved and now compensated. Weight down to 98 lb (119 lb on admit). Cardiology consulted during this hospital stay. Now complicated by development of respiratory distress from aspiration. After discussion with family we have transitioned to comfort measures. Discontinue beta blockers, lisinopril-and continue Lasix for comfort.  Atrial fibrillation with RVR:EF-25% to 30% per Echo June 2016.CHad2Vasc2 score 5 - amiodarone started per Cardiology. Heart rate now much better after slowly titrating her metoprolol. Unfortunately now on 7/5 deteriorating from acute respiratory distress secondary to aspiration. Have spoken with multiple family members, we have position to comfort care. Will discontinue amiodarone, metoprolol and Coumadin. Discontinue telemetry.  History of dysphagia:PEG tube in place and was on bolus feeds. Unfortunately seems to have dramatically worsened on 7/5 with pooling of secretions and tachypnea-high suspicion for aspiration pneumonia. Transitioned to comfort measures.  Hypothyroidism: Since transition to comfort measures-discontinued Synthroid   Dementia: Mental status worsened this morning-likely secondary to encephalopathy from pneumonia.   Right knee pain:Denies pain-no swelling or erythema seen- follow clinically for now   Hx of CVA: Was on coumadin-hx of Afib-now discontinued given comfort measures in effect  Disposition: Remain inpatient-suspect she may need residential hospice-will need to be evaluated tomorrow  Antimicrobial agents  See below  Anti-infectives    Start     Dose/Rate Route Frequency Ordered Stop   08/23/14 1000  vancomycin (VANCOCIN) 500 mg in sodium chloride 0.9 % 100 mL IVPB  Status:  Discontinued     500 mg 100 mL/hr over 60 Minutes Intravenous Every 24 hours 07/28/2014 0849 08/25/14 1026   08/17/2014 1500   piperacillin-tazobactam (ZOSYN) IVPB 3.375 g  Status:  Discontinued  3.375 g 12.5 mL/hr over 240 Minutes Intravenous 3 times per day 04-Sep-2014 0849 08/25/14 1026   09/04/14 0900  piperacillin-tazobactam (ZOSYN) IVPB 3.375 g     3.375 g 100 mL/hr over 30 Minutes Intravenous  Once September 04, 2014 0847 09-04-14 1057   09/04/2014 0900  vancomycin (VANCOCIN) IVPB 1000 mg/200 mL premix     1,000 mg 200 mL/hr over 60 Minutes Intravenous  Once 09/04/2014 0847 09/04/14 1119      DVT Prophylaxis: Coumadin  Code Status: DNR  Family Communication Sister [name] over the phone [phone])- multiple times today Daughter.-Rhonda-over the phone today Grandson-Christopher over the phone today  Procedures: None  CONSULTS:  cardiology  Time spent 60 minutes  MEDICATIONS: Scheduled Meds: . furosemide  40 mg Per Tube Daily   Continuous Infusions: . morphine 2 mg/hr (08/29/14 1304)   PRN Meds:.acetaminophen **OR** acetaminophen, atropine, ipratropium, levalbuterol, LORazepam, morphine injection, morphine CONCENTRATE, ondansetron **OR** ondansetron (ZOFRAN) IV, polyvinyl alcohol    PHYSICAL EXAM: Vital signs in last 24 hours: Filed Vitals:   08/28/14 2029 08/29/14 0424 08/29/14 0857 08/29/14 1255  BP: 130/93 121/82 134/69 107/76  Pulse: 111 108 97 98  Temp: 97.6 F (36.4 C) 97.6 F (36.4 C) 98.3 F (36.8 C) 97.6 F (36.4 C)  TempSrc: Oral Oral Oral Oral  Resp: 18 18 19 22   Height:      Weight:  46.6 kg (102 lb 11.8 oz)    SpO2: 100% 93% 96% 96%    Weight change: 1.8 kg (3 lb 15.5 oz) Filed Weights   08/27/14 0436 08/28/14 0642 08/29/14 0424  Weight: 46.8 kg (103 lb 2.8 oz) 44.8 kg (98 lb 12.3 oz) 46.6 kg (102 lb 11.8 oz)   Body mass index is 18.48 kg/(m^2).   Gen Exam: Awake, very lethargic but tachypneic. "Gurgling" Neck: Supple  Chest: Rales all over this morning-transmitted upper airway sounds CVS: S1 S2 irregular Abdomen: soft, BS +, non tender, non distended.    Extremities: no edema, lower extremities warm to touch.  Intake/Output from previous day:  Intake/Output Summary (Last 24 hours) at 08/29/14 1327 Last data filed at 08/29/14 0981  Gross per 24 hour  Intake   1098 ml  Output      0 ml  Net   1098 ml     LAB RESULTS: CBC  Recent Labs Lab 08/23/14 0030 08/24/14 0243 08/24/14 1138 08/25/14 0300  WBC 4.8 7.5 7.0 7.6  HGB 13.0 13.3 12.6 13.4  HCT 39.2 41.3 39.1 42.8  PLT 208 217 214 234  MCV 90.3 90.8 91.4 92.8  MCH 30.0 29.2 29.4 29.1  MCHC 33.2 32.2 32.2 31.3  RDW 15.9* 16.3* 16.4* 16.7*  LYMPHSABS  --   --  0.2*  --   MONOABS  --   --  0.8  --   EOSABS  --   --  0.0  --   BASOSABS  --   --  0.0  --     Chemistries   Recent Labs Lab 08/23/14 0030 08/24/14 0243 08/24/14 1138 08/25/14 0300  NA 133* 135 136 140  K 3.2* 3.4* 3.7 4.0  CL 90* 89* 91* 91*  CO2 26 34* 32 37*  GLUCOSE 206* 123* 177* 105*  BUN 32* 35* 36* 37*  CREATININE 0.91 0.78 0.81 0.80  CALCIUM 9.1 9.1 8.8* 9.1  MG  --   --  2.2 2.4    CBG:  Recent Labs Lab 08/28/14 1640 08/28/14 2028 08/29/14 0026 08/29/14 0416 08/29/14 1914  GLUCAP 165* 148* 122* 124* 164*    GFR Estimated Creatinine Clearance: 39.2 mL/min (by C-G formula based on Cr of 0.8).  Coagulation profile  Recent Labs Lab 08/25/14 0300 08/26/14 0356 08/27/14 0500 08/28/14 0320 08/29/14 0527  INR 4.14* 4.01* 2.59* 1.99* 1.74*    Cardiac Enzymes  Recent Labs Lab 09-06-2014 1823 08/23/14 0030  TROPONINI 0.04* 0.03    Invalid input(s): POCBNP No results for input(s): DDIMER in the last 72 hours. No results for input(s): HGBA1C in the last 72 hours. No results for input(s): CHOL, HDL, LDLCALC, TRIG, CHOLHDL, LDLDIRECT in the last 72 hours. No results for input(s): TSH, T4TOTAL, T3FREE, THYROIDAB in the last 72 hours.  Invalid input(s): FREET3 No results for input(s): VITAMINB12, FOLATE, FERRITIN, TIBC, IRON, RETICCTPCT in the last 72 hours. No results for  input(s): LIPASE, AMYLASE in the last 72 hours.  Urine Studies No results for input(s): UHGB, CRYS in the last 72 hours.  Invalid input(s): UACOL, UAPR, USPG, UPH, UTP, UGL, UKET, UBIL, UNIT, UROB, ULEU, UEPI, UWBC, URBC, UBAC, CAST, UCOM, BILUA  MICROBIOLOGY: Recent Results (from the past 240 hour(s))  Blood culture (routine x 2)     Status: None   Collection Time: 09/06/2014  6:00 AM  Result Value Ref Range Status   Specimen Description BLOOD RIGHT ANTECUBITAL  Final   Special Requests BOTTLES DRAWN AEROBIC AND ANAEROBIC  Final   Culture NO GROWTH 5 DAYS  Final   Report Status 08/27/2014 FINAL  Final  Blood culture (routine x 2)     Status: None   Collection Time: 2014-09-06  9:20 AM  Result Value Ref Range Status   Specimen Description BLOOD BLOOD RIGHT FOREARM  Final   Special Requests BOTTLES DRAWN AEROBIC AND ANAEROBIC  Final   Culture NO GROWTH 5 DAYS  Final   Report Status 08/27/2014 FINAL  Final  MRSA PCR Screening     Status: None   Collection Time: 2014/09/06 12:30 PM  Result Value Ref Range Status   MRSA by PCR NEGATIVE NEGATIVE Final    Comment:        The GeneXpert MRSA Assay (FDA approved for NASAL specimens only), is one component of a comprehensive MRSA colonization surveillance program. It is not intended to diagnose MRSA infection nor to guide or monitor treatment for MRSA infections.     RADIOLOGY STUDIES/RESULTS: Dg Chest Port 1 View  09/06/14   CLINICAL DATA:  Initial evaluation for acute shortness of breath.  EXAM: PORTABLE CHEST - 1 VIEW  COMPARISON:  Prior radiograph from 08/12/2013  FINDINGS: Cardiomegaly again seen, grossly stable. Mediastinal silhouette within normal limits. Atheromatous plaque present within the intrathoracic aorta. Metallic watch overlies of the upper abdomen.  Lungs are normally inflated. Moderate bilateral pleural effusions are present, left greater than right. There is diffuse pulmonary vascular congestion with  indistinctness of the interstitial markings, suggesting moderate diffuse pulmonary edema. No definite focal infiltrates. No pneumothorax.  No acute osseus abnormality. Surgical clips overlie the right axilla.  IMPRESSION: Cardiomegaly with moderate diffuse pulmonary edema and bilateral pleural effusions, left greater than right.   Electronically Signed   By: Rise Mu M.D.   On: 09/06/14 06:53    Jeoffrey Massed, MD  [facility] Pager:[phone]  If 7PM-7AM, please contact night-coverage [email or web address] Password [email or web address] 08/29/2014, 1:27 PM   LOS: 7 days

## 2014-08-29 NOTE — Plan of Care (Signed)
Problem: Consults Goal: Atrial Arhythmia Patient Education (See Patient Education module for education specifics.)  Outcome: Not Applicable Date Met:  18/36/72 Patient changed to palliative care today

## 2014-08-29 NOTE — Progress Notes (Signed)
Dr. Kathrynn SpeedGilmire spoke with multiple family members as follows regarding start on Morphine drip and patient's condition: Family Communication Sister Bayard Beaverva Moody over the phone [phone])- multiple times today Daughter.-Rhonda-over the phone today Grandson-Christopher over the phone today

## 2014-08-29 NOTE — Progress Notes (Signed)
Follow-Up Nutrition Assessment  INTERVENTION: Pt has been transitioned to comfort care, no further interventions; RD signing-off  NUTRITION DIAGNOSIS:  Inadequate oral intake related to dysphagia as evidenced by  (supplemental TF via PEG tube)  New GOAL: Comfort care  MONITOR:  TF tolerance, Labs, Weight trends, I & O's, Diet advancement  REASON FOR ASSESSMENT:  Consult Enteral/tube feeding initiation and management  ASSESSMENT: 81 y.o. Female with PMH of atrial fibrillation on Coumadin, dementia, prior CVA, presented with acute shortness of breath and difficulty breathing. Patient recently had a right hip repair. Chest x-ray showed pulmonary edema with bilateral pleural effusions, BNP 1058.  Per MD note, pt's family is agreeable for hospice and comfort measures. Pt has been transitioned to full comfort measures and discontinue peg feedings as well. RD signing-off   Height:  Ht Readings from Last 1 Encounters:  08/25/14 5' 2.5" (1.588 m)    Weight:  Wt Readings from Last 1 Encounters:  08/29/14 102 lb 11.8 oz (46.6 kg)    Ideal Body Weight:  50 kg  Wt Readings from Last 10 Encounters:  08/29/14 102 lb 11.8 oz (46.6 kg)  02/23/14 107 lb (48.535 kg)  08/13/13 94 lb 2.2 oz (42.7 kg)    BMI:  Body mass index is 18.48 kg/(m^2).  Estimated Nutritional Needs:  Kcal:  1600-1800  Protein:  70-80 gm  Fluid:  1.6-1.8 L  Skin:  Reviewed, no issues  Diet Order:  Diet NPO time specified Except for: Ice Chips  EDUCATION NEEDS:  No education needs identified at this time   Intake/Output Summary (Last 24 hours) at 08/29/14 1529 Last data filed at 08/29/14 1400  Gross per 24 hour  Intake 787.87 ml  Output      0 ml  Net 787.87 ml    Ian Malkineanne Barnett RD, LDN Inpatient Clinical Dietitian Pager: [phone] After Hours Pager: [phone]

## 2014-08-29 NOTE — Progress Notes (Addendum)
Markedly short of breath this morning-very tachypneic and pooling secretions in the back of her throat. On exam she has rales all over with significant transmitted upper airway sounds. She appears quite uncomfortable. She is following commands and is able to nod head yes and no, speaking in 1 word sentences. I confirmed DO NOT RESUSCITATE with patient, subsequently also confirmed that she does not want any heroic care.  I have  subsequently spoken with patient's sister, grandson and her only daughter over the phone. I have also reached out to the social worker, who in turn contacted patient's facility-apparently there is not a formal health attorney, but apparently patient wanted her grand son [phone](Tamara Bailey-[phone]) and her sister involved in her care-she had apparently updated paperwork regarding this. I subsequently spoke with grandson-Tamara Bailey over the phone who did confirm that the patient's daughter Tamara Bailey(Tamara Bailey [phone]) is not involved in her care, and that the patient wanted him and her sister to be the primary contact person making decisions for her and not her daughter. I subsequently was able to get in touch with patient's daughter-Tamara Bailey who confirmed that she does not make any decisions for her mother-and also confirmed that her mother did not want her involved in her care. She was aware that patient was deteriorating and plans for hospice. I have also spoken with the patient's sister-Tamara Bailey over the phone-was aware that the patient was dying and was agreeable to hospice/comfort measures. Tamara Bailey-patient's grandson also agreeable for hospice and comfort measures. We will transition to full comfort measures-stop all medications including Coumadin. Cautiously continue with Lasix for comfort. Start morphine/Ativan. Since patient has significant residuals (around the 125 mL) we will discontinue peg feedings as well.

## 2014-08-29 NOTE — Progress Notes (Signed)
Respirations = 36/min and labored with shallow breathing.  Opens eyes to name called.  Morphine drip increased from 52ml/hr to 533ml/hr.

## 2014-08-29 NOTE — Progress Notes (Signed)
Suctioned secretions prn orally

## 2014-08-29 NOTE — Progress Notes (Signed)
CSW received call from [facility]: Lambert Rhonda WKelly- Admissions Coordinator- [place].  Per information received from patient's sister Ava Mitzi HansenMoody- should patient expire [facility] [place] [facility] [place]. Their number is [phone].  This information relayed to patient's nurse- Margaretmary Bayleyonna Owens.  Lorri Frederickonna T. Jaci LazierCrowder, [place] [phone]

## 2014-08-29 NOTE — Progress Notes (Addendum)
CSW notified earlier today that [facility] and MD is recommending comfort measures.  CSW spoke with Tresa EndoKelly- Admissions Director at faclity- bed is available for patient and they would make Hospice referral for patient. A thorough conversation was made by Dr. Jerral RalphGhimire to patient's sister Ava, daughter Bjorn LoserRhonda and grandson Thayer OhmChris re: comfort measures and all are in agreement as is patient.  She does NOT appear to have a Health Care Power of Attorney and this was confirmed through patient's sister and the SNF.  Patient had indicated to the SNF that she wanted her sister or grandson Denny Peonvery (whom she assigned as her Executor) to assist her with any issues.  Daughter Bjorn LoserRhonda stated that her mother was clear in the past that she did not want her involved in her decision making and she was ok with Ava and Thayer OhmChris making decisions.  Patient was scheduled for d/c to the SNF but then MD cancelled as patient had a significant change in her medical condition. She was placed on a morphine drip and comfort measures have been initiated.  MD anticipates a hospital death.  Will re-evaluate tomorrow if she stabilizes- will need to [facility]Com[facility] or seek residential hospice placement.  CSW discussed with SW supervisor- York RamZach Brooks and also discussed above with Dr. Jerral RalphGhimire.  Lorri Frederickonna T. Jaci LazierCrowder, [place] [phone]

## 2014-08-29 NOTE — Progress Notes (Signed)
PT Cancellation Note  Patient Details Name: Tamara Bailey MRN: [id number] DOB: 08/26/1929   Cancelled Treatment:     Noted transition to comfort measures per MD note, will sign off.   Fabio AsaWerner, Quavon Keisling J 08/29/2014, 2:40 PM Tamara Crumbevon Deonta Bailey, PT DPT  [phone]

## 2014-08-30 DIAGNOSIS — Z7189 Other specified counseling: Secondary | ICD-10-CM

## 2014-08-30 DIAGNOSIS — A419 Sepsis, unspecified organism: Principal | ICD-10-CM

## 2014-08-30 DIAGNOSIS — R1310 Dysphagia, unspecified: Secondary | ICD-10-CM

## 2014-08-30 DIAGNOSIS — J690 Pneumonitis due to inhalation of food and vomit: Secondary | ICD-10-CM

## 2014-08-30 MED ORDER — MORPHINE SULFATE (CONCENTRATE) 10 MG/0.5ML PO SOLN
20.0000 mg | ORAL | Status: DC | PRN
  Administered 2014-08-30 – 2014-08-31 (×3): 20 mg via SUBLINGUAL
  Filled 2014-08-30 (×3): qty 1

## 2014-08-30 MED ORDER — OXYCODONE HCL 5 MG/5ML PO SOLN: 10.0000 mg | ORAL | Status: DC | PRN

## 2014-08-30 NOTE — Progress Notes (Signed)
Patient discussed with MD and interdisciplinary staff this morning in meeting.  Patient is resting comfortably and will e changed from IV morphine to Roxanol.  Hospital death is still anticipated-however- if she stabilizes by tomorrow- plan will be [facility] with Hospice care for comfort.  CSW notified Tresa EndoKelly- [facility].  Per Margaretmary Bayleyonna Owens, RN- patient's sister plans to come to the hospital today to see patient.   Signed Fl2 and DNR on front of chart.  Lorri Frederickonna T. Jaci LazierCrowder, [place] [phone]

## 2014-08-30 NOTE — Progress Notes (Signed)
Notified hospitalist of patient having a temp of 101 when nurse tech did vitals. Rechecked temp orally-98.7, Axillary-100.8. Will administer tylenol per rectum per PRN order and continue monitoring patient to end of shift.

## 2014-08-30 NOTE — Progress Notes (Signed)
PATIENT DETAILS Name: Tamara Bailey Age: 81 y.o. Sex: female Date of Birth: 10/31/1929 Admit Date: 12-Jan-2015 Admitting Physician Ripudeep Jenna LuoK Rai, MD NFA:[id number],IONPCP:[id number],HAL Maisie FusHOMAS, MD  Brief narrative  81 year old patient who is a resident of a skilled nursing facility history of atrial fibrillation on Coumadin, dementia, CVA, history of dysphagia on PEG tube feedings presented with shortness of breath secondary to aspiration pneumonia and acute systolic heart failure. Hospital course complicated by development of A. fib with RVR. Although initially improved, unfortunately he developed acute respiratory distress on 7/5 with evidence of pooling secretions. After discussion with multiple family members, we have transitioned to comfort care.  Subjective: Patient is minimally responsive, opens her eyes occasionally however is nonverbal cannot follow commands or have meaningful movements  Assessment/Plan: Principal Problem: Sepsis due to Aspiration pneumonitis versus HCAP:  -Patient having a history of advanced dementia and CVA likely having recurrent aspirations  -She completed IV Abx course during this hospitalization -Went into respiratory failure on 08/29/2014, I suspect she aspirated on her own secretions given her history  Active Problems: Acute respiratory failure - Evidenced by patient going into respiratory distress, having respiratory rate in 40s as reported by nursing staff, likely secondary to aspiration  Acute on Chronic Severe Systolic CHF -She was treated with IV Lasix during this hospitalization  History of dysphagia: -As mentioned above patient having a history of CVA and advanced dementia  -PEG tube in place and was on bolus feeds. Unfortunately seems to have dramatically worsened on 7/5 with pooling of secretions and tachypnea-high suspicion for aspiration pneumonia. Transitioned to comfort measures.  Goals of care  -I spoke with patient's brother and  sister at bedside regarding goals of care. Explained Tamara Bailey's overall poor prognosis and unlikelihood of obtaining a meaningful recovery at this point. With her history of advanced dementia and CVA she will likely continue to aspirate. Family members expressing desire to focus on comfort and quality of life. They would like for her to go back to her skilled nursing facility with hospice care rather than inpatient hospice.  -Morphine drip was discontinued as she was started on Roxanol, as this would facilitate transition to skilled nursing facility with hospice services. -Patient may pass in the next 24 hours -Will continue to focus her care on comfort  Disposition: Patient may pass in the next 24 hours.   Antimicrobial agents  See below  Anti-infectives    Start     Dose/Rate Route Frequency Ordered Stop   08/23/14 1000  vancomycin (VANCOCIN) 500 mg in sodium chloride 0.9 % 100 mL IVPB  Status:  Discontinued     500 mg 100 mL/hr over 60 Minutes Intravenous Every 24 hours 02-17-2015 0849 08/25/14 1026   02-17-2015 1500  piperacillin-tazobactam (ZOSYN) IVPB 3.375 g  Status:  Discontinued     3.375 g 12.5 mL/hr over 240 Minutes Intravenous 3 times per day 02-17-2015 0849 08/25/14 1026   02-17-2015 0900  piperacillin-tazobactam (ZOSYN) IVPB 3.375 g     3.375 g 100 mL/hr over 30 Minutes Intravenous  Once 02-17-2015 0847 02-17-2015 1057   02-17-2015 0900  vancomycin (VANCOCIN) IVPB 1000 mg/200 mL premix     1,000 mg 200 mL/hr over 60 Minutes Intravenous  Once 02-17-2015 0847 02-17-2015 1119      DVT Prophylaxis: Coumadin  Code Status: DNR  Family Communication Spoke with her sister and brother at bedside today  Procedures: None  CONSULTS:  cardiology  Time spent 30 minutes, 20 min spent in face to cafe discussion with family regarding goals of care  MEDICATIONS: Scheduled Meds: . furosemide  40 mg Per Tube Daily   Continuous Infusions:   PRN Meds:.acetaminophen **OR**  acetaminophen, atropine, ipratropium, levalbuterol, LORazepam, morphine injection, morphine CONCENTRATE, ondansetron **OR** ondansetron (ZOFRAN) IV, polyvinyl alcohol    PHYSICAL EXAM: Vital signs in last 24 hours: Filed Vitals:   08/29/14 0857 08/29/14 1255 08/29/14 2151 08/30/14 0912  BP: 134/69 107/76 89/57 88/58   Pulse: 97 98 115 90  Temp: 98.3 F (36.8 C) 97.6 F (36.4 C) 98.6 F (37 C)   TempSrc: Oral Oral Oral   Resp: 19 22 14    Height:      Weight:      SpO2: 96% 96% 97% 98%    Weight change:  Filed Weights   08/27/14 0436 08/28/14 0642 08/29/14 0424  Weight: 46.8 kg (103 lb 2.8 oz) 44.8 kg (98 lb 12.3 oz) 46.6 kg (102 lb 11.8 oz)   Body mass index is 18.48 kg/(m^2).   Gen Exam: Minimally responsive, can not follow commands, no purposeful movements.  Neck: Supple  Chest: Rales all over this morning-transmitted upper airway sounds CVS: S1 S2 irregular Abdomen: soft, BS +, non tender, non distended.  Extremities: no edema, lower extremities warm to touch.  Intake/Output from previous day:  Intake/Output Summary (Last 24 hours) at 08/30/14 1444 Last data filed at 08/30/14 1005  Gross per 24 hour  Intake  11.33 ml  Output    925 ml  Net -913.67 ml     LAB RESULTS: CBC  Recent Labs Lab 08/24/14 0243 08/24/14 1138 08/25/14 0300  WBC 7.5 7.0 7.6  HGB 13.3 12.6 13.4  HCT 41.3 39.1 42.8  PLT 217 214 234  MCV 90.8 91.4 92.8  MCH 29.2 29.4 29.1  MCHC 32.2 32.2 31.3  RDW 16.3* 16.4* 16.7*  LYMPHSABS  --  0.2*  --   MONOABS  --  0.8  --   EOSABS  --  0.0  --   BASOSABS  --  0.0  --     Chemistries   Recent Labs Lab 08/24/14 0243 08/24/14 1138 08/25/14 0300  NA 135 136 140  K 3.4* 3.7 4.0  CL 89* 91* 91*  CO2 34* 32 37*  GLUCOSE 123* 177* 105*  BUN 35* 36* 37*  CREATININE 0.78 0.81 0.80  CALCIUM 9.1 8.8* 9.1  MG  --  2.2 2.4    CBG:  Recent Labs Lab 08/28/14 1640 08/28/14 2028 08/29/14 0026 08/29/14 0416 08/29/14 0743  GLUCAP  165* 148* 122* 124* 164*    GFR Estimated Creatinine Clearance: 39.2 mL/min (by C-G formula based on Cr of 0.8).  Coagulation profile  Recent Labs Lab 08/25/14 0300 08/26/14 0356 08/27/14 0500 08/28/14 0320 08/29/14 0527  INR 4.14* 4.01* 2.59* 1.99* 1.74*    Cardiac Enzymes No results for input(s): CKMB, TROPONINI, MYOGLOBIN in the last 168 hours.  Invalid input(s): CK  Invalid input(s): POCBNP No results for input(s): DDIMER in the last 72 hours. No results for input(s): HGBA1C in the last 72 hours. No results for input(s): CHOL, HDL, LDLCALC, TRIG, CHOLHDL, LDLDIRECT in the last 72 hours. No results for input(s): TSH, T4TOTAL, T3FREE, THYROIDAB in the last 72 hours.  Invalid input(s): FREET3 No results for input(s): VITAMINB12, FOLATE, FERRITIN, TIBC, IRON, RETICCTPCT in the last 72 hours. No results for input(s): LIPASE, AMYLASE in the last 72 hours.  Urine Studies No results  for input(s): UHGB, CRYS in the last 72 hours.  Invalid input(s): UACOL, UAPR, USPG, UPH, UTP, UGL, UKET, UBIL, UNIT, UROB, ULEU, UEPI, UWBC, URBC, UBAC, CAST, UCOM, BILUA  MICROBIOLOGY: Recent Results (from the past 240 hour(s))  Blood culture (routine x 2)     Status: None   Collection Time: 07/31/2014  6:00 AM  Result Value Ref Range Status   Specimen Description BLOOD RIGHT ANTECUBITAL  Final   Special Requests BOTTLES DRAWN AEROBIC AND ANAEROBIC  Final   Culture NO GROWTH 5 DAYS  Final   Report Status 08/27/2014 FINAL  Final  Blood culture (routine x 2)     Status: None   Collection Time: 08/08/2014  9:20 AM  Result Value Ref Range Status   Specimen Description BLOOD BLOOD RIGHT FOREARM  Final   Special Requests BOTTLES DRAWN AEROBIC AND ANAEROBIC  Final   Culture NO GROWTH 5 DAYS  Final   Report Status 08/27/2014 FINAL  Final  MRSA PCR Screening     Status: None   Collection Time: 08/05/2014 12:30 PM  Result Value Ref Range Status   MRSA by PCR NEGATIVE NEGATIVE Final     Comment:        The GeneXpert MRSA Assay (FDA approved for NASAL specimens only), is one component of a comprehensive MRSA colonization surveillance program. It is not intended to diagnose MRSA infection nor to guide or monitor treatment for MRSA infections.     RADIOLOGY STUDIES/RESULTS: Dg Chest Port 1 View  08/10/2014   CLINICAL DATA:  Initial evaluation for acute shortness of breath.  EXAM: PORTABLE CHEST - 1 VIEW  COMPARISON:  Prior radiograph from 08/12/2013  FINDINGS: Cardiomegaly again seen, grossly stable. Mediastinal silhouette within normal limits. Atheromatous plaque present within the intrathoracic aorta. Metallic watch overlies of the upper abdomen.  Lungs are normally inflated. Moderate bilateral pleural effusions are present, left greater than right. There is diffuse pulmonary vascular congestion with indistinctness of the interstitial markings, suggesting moderate diffuse pulmonary edema. No definite focal infiltrates. No pneumothorax.  No acute osseus abnormality. Surgical clips overlie the right axilla.  IMPRESSION: Cardiomegaly with moderate diffuse pulmonary edema and bilateral pleural effusions, left greater than right.   Electronically Signed   By: Rise Mu M.D.   On: 08/09/2014 06:53    Jeralyn Bennett, MD  [facility] Pager:[phone]  If 7PM-7AM, please contact night-coverage [email or web address] Password [email or web address] 08/30/2014, 2:44 PM   LOS: 8 days

## 2014-09-01 NOTE — Progress Notes (Signed)
Patient expired 09/13/2014. Not[facility]Kelly[facility].  CSW signing off.  Lorri Frederickonna T. Jaci LazierCrowder, Kentucky[place]CSW [phone]

## 2014-09-01 NOTE — Care Management (Signed)
Important Message  Patient Details  Name: Tamara PouchMary Ellen Bailey MRN: [id number] Date of Birth: 03/08/1929   Medicare Important Message Given:  Yes-third notification given    Kyla BalzarineShealy, Pookela Sellin Abena 09/01/2014, 10:16 AM

## 2014-09-04 NOTE — Care Management (Signed)
Pt was given IM on Thursday August 31, 2014

## 2014-09-25 NOTE — Progress Notes (Signed)
PATIENT DETAILS Name: Tamara Bailey Age: 81 y.o. Sex: female Date of Birth: February 11, 1930 Admit Date: 2014-09-02 Admitting Physician Ripudeep Jenna Luo, MD ZOX:[id number] Tamara Fus, MD  Brief narrative  81 year old patient who is a resident of a skilled nursing facility history of atrial fibrillation on Coumadin, dementia, CVA, history of dysphagia on PEG tube feedings presented with shortness of breath secondary to aspiration pneumonia and acute systolic heart failure. Hospital course complicated by development of A. fib with RVR. Although initially improved, unfortunately he developed acute respiratory distress on 7/5 with evidence of pooling secretions. After discussion with multiple family members, we have transitioned to comfort care.  Subjective: Patient is minimally responsive, opens her eyes occasionally however is nonverbal cannot follow commands or have meaningful movements  Assessment/Plan:  Sepsis due to Aspiration pneumonitis versus HCAP:  -Patient having a history of advanced dementia and CVA likely having recurrent aspirations  -She completed IV Abx course during this hospitalization -Went into respiratory failure on 08/29/2014, I suspect she aspirated on her own secretions given her history  Acute respiratory failure - Evidenced by patient going into respiratory distress, having respiratory rate in 40s as reported by nursing staff, likely secondary to aspiration  Acute on Chronic Severe Systolic CHF -She was treated with IV Lasix during this hospitalization  History of dysphagia: -As mentioned above patient having a history of CVA and advanced dementia  -PEG tube in place and was on bolus feeds. Unfortunately seems to have dramatically worsened on 7/5 with pooling of secretions and tachypnea-high suspicion for aspiration pneumonia. Transitioned to comfort measures.  Goals of care  -I spoke with patient's brother and sister at bedside regarding goals of  care. Explained Tamara Bailey's overall poor prognosis and unlikelihood of obtaining a meaningful recovery at this point. With her history of advanced dementia and CVA she will likely continue to aspirate. Family members expressing desire to focus on comfort and quality of life. They would like for her to go back to her skilled nursing facility with hospice care rather than inpatient hospice.  -Morphine drip was discontinued as she was started on Roxanol, as this would facilitate transition to skilled nursing facility with hospice services. -Initially we had planned on a transition to SNF today with hospice services following, however, based on my exam today I think she may be to unstable for transfer. She is completely unresponsive, mottled, having agonal breaths. This is a significant change since yesterday. 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  -I am holding transfer for today since she will likely pass in the next 24 hours   Disposition: Patient may pass in the next 24 hours.   Antimicrobial agents  See below  Anti-infectives    Start     Dose/Rate Route Frequency Ordered Stop   08/23/14 1000  vancomycin (VANCOCIN) 500 mg in sodium chloride 0.9 % 100 mL IVPB  Status:  Discontinued     500 mg 100 mL/hr over 60 Minutes Intravenous Every 24 hours 09/02/14 0849 08/25/14 1026   2014-09-02 1500  piperacillin-tazobactam (ZOSYN) IVPB 3.375 g  Status:  Discontinued     3.375 g 12.5 mL/hr over 240 Minutes Intravenous 3 times per day 02-Sep-2014 0849 08/25/14 1026   09-02-2014 0900  piperacillin-tazobactam (ZOSYN) IVPB 3.375 g     3.375 g 100 mL/hr over 30 Minutes Intravenous  Once 09-02-14 0847 09-02-14 1057   09/02/14 0900  vancomycin (VANCOCIN) IVPB 1000 mg/200 mL premix  1,000 mg 200 mL/hr over 60 Minutes Intravenous  Once 08/07/2014 0847 08/21/2014 1119      DVT Prophylaxis: Coumadin  Code Status: DNR  Family Communication Spoke with her sister and brother at bedside  today  Procedures: None  CONSULTS:  cardiology  Time spent 20 min  MEDICATIONS: Scheduled Meds: . furosemide  40 mg Per Tube Daily   Continuous Infusions:   PRN Meds:.acetaminophen **OR** [DISCONTINUED] acetaminophen, atropine, ipratropium, levalbuterol, LORazepam, morphine injection, morphine CONCENTRATE, ondansetron **OR** ondansetron (ZOFRAN) IV, polyvinyl alcohol    PHYSICAL EXAM: Vital signs in last 24 hours: Filed Vitals:   08/30/14 2204 09/29/14 0000 2014/09/29 0543 09-29-2014 0616  BP:   110/62   Pulse:   132   Temp: 100.8 F (38.2 C)  100 F (37.8 C)   TempSrc: Axillary  Oral   Resp:  20 22 30   Height:      Weight:   45.2 kg (99 lb 10.4 oz)   SpO2:   99%     Weight change:  Filed Weights   08/28/14 0642 08/29/14 0424 2014-09-29 0543  Weight: 44.8 kg (98 lb 12.3 oz) 46.6 kg (102 lb 11.8 oz) 45.2 kg (99 lb 10.4 oz)   Body mass index is 17.92 kg/(m^2).   Gen Exam: Nonresponsive, mottled, agonal breaths.  Neck: Supple  Chest: She has coarse respiratory sounds, rhonchi, crackles, agonal breathing pattern CVS: S1 S2 irregular Abdomen: soft, BS +, non tender, non distended.  Extremities: no edema, cool, mottled  Intake/Output from previous day:  Intake/Output Summary (Last 24 hours) at 09-29-2014 1424 Last data filed at 2014/09/29 0334  Gross per 24 hour  Intake      0 ml  Output    450 ml  Net   -450 ml     LAB RESULTS: CBC  Recent Labs Lab 08/25/14 0300  WBC 7.6  HGB 13.4  HCT 42.8  PLT 234  MCV 92.8  MCH 29.1  MCHC 31.3  RDW 16.7*    Chemistries   Recent Labs Lab 08/25/14 0300  NA 140  K 4.0  CL 91*  CO2 37*  GLUCOSE 105*  BUN 37*  CREATININE 0.80  CALCIUM 9.1  MG 2.4    CBG:  Recent Labs Lab 08/28/14 1640 08/28/14 2028 08/29/14 0026 08/29/14 0416 08/29/14 0743  GLUCAP 165* 148* 122* 124* 164*    GFR Estimated Creatinine Clearance: 38 mL/min (by C-G formula based on Cr of 0.8).  Coagulation profile  Recent  Labs Lab 08/25/14 0300 08/26/14 0356 08/27/14 0500 08/28/14 0320 08/29/14 0527  INR 4.14* 4.01* 2.59* 1.99* 1.74*    Cardiac Enzymes No results for input(s): CKMB, TROPONINI, MYOGLOBIN in the last 168 hours.  Invalid input(s): CK  Invalid input(s): POCBNP No results for input(s): DDIMER in the last 72 hours. No results for input(s): HGBA1C in the last 72 hours. No results for input(s): CHOL, HDL, LDLCALC, TRIG, CHOLHDL, LDLDIRECT in the last 72 hours. No results for input(s): TSH, T4TOTAL, T3FREE, THYROIDAB in the last 72 hours.  Invalid input(s): FREET3 No results for input(s): VITAMINB12, FOLATE, FERRITIN, TIBC, IRON, RETICCTPCT in the last 72 hours. No results for input(s): LIPASE, AMYLASE in the last 72 hours.  Urine Studies No results for input(s): UHGB, CRYS in the last 72 hours.  Invalid input(s): UACOL, UAPR, USPG, UPH, UTP, UGL, UKET, UBIL, UNIT, UROB, ULEU, UEPI, UWBC, URBC, UBAC, CAST, UCOM, BILUA  MICROBIOLOGY: Recent Results (from the past 240 hour(s))  Blood culture (routine x 2)  Status: None   Collection Time: 11/25/14  6:00 AM  Result Value Ref Range Status   Specimen Description BLOOD RIGHT ANTECUBITAL  Final   Special Requests BOTTLES DRAWN AEROBIC AND ANAEROBIC 10ML  Final   Culture NO GROWTH 5 DAYS  Final   Report Status 08/27/2014 FINAL  Final  Blood culture (routine x 2)     Status: None   Collection Time: 11/25/14  9:20 AM  Result Value Ref Range Status   Specimen Description BLOOD BLOOD RIGHT FOREARM  Final   Special Requests BOTTLES DRAWN AEROBIC AND ANAEROBIC 4ML  Final   Culture NO GROWTH 5 DAYS  Final   Report Status 08/27/2014 FINAL  Final  MRSA PCR Screening     Status: None   Collection Time: 11/25/14 12:30 PM  Result Value Ref Range Status   MRSA by PCR NEGATIVE NEGATIVE Final    Comment:        The GeneXpert MRSA Assay (FDA approved for NASAL specimens only), is one component of a comprehensive MRSA colonization surveillance  program. It is not intended to diagnose MRSA infection nor to guide or monitor treatment for MRSA infections.     RADIOLOGY STUDIES/RESULTS: Dg Chest Port 1 View  2014-10-29   CLINICAL DATA:  Initial evaluation for acute shortness of breath.  EXAM: PORTABLE CHEST - 1 VIEW  COMPARISON:  Prior radiograph from 08/12/2013  FINDINGS: Cardiomegaly again seen, grossly stable. Mediastinal silhouette within normal limits. Atheromatous plaque present within the intrathoracic aorta. Metallic watch overlies of the upper abdomen.  Lungs are normally inflated. Moderate bilateral pleural effusions are present, left greater than right. There is diffuse pulmonary vascular congestion with indistinctness of the interstitial markings, suggesting moderate diffuse pulmonary edema. No definite focal infiltrates. No pneumothorax.  No acute osseus abnormality. Surgical clips overlie the right axilla.  IMPRESSION: Cardiomegaly with moderate diffuse pulmonary edema and bilateral pleural effusions, left greater than right.   Electronically Signed   By: Rise MuBenjamin  McClintock M.D.   On: 02016-09-04 06:53    Jeralyn BennettZAMORA, Shabrea Weldin, MD  [facility] Pager:[phone]  If 7PM-7AM, please contact night-coverage [email or web address] Password [email or web address] 09/16/2014, 2:24 PM   LOS: 9 days

## 2014-09-25 NOTE — Progress Notes (Signed)
At 1615 RN walked by pt room and pt was not breathing, pt listened for heart beat and was absent, Dr. Vanessa BarbaraZamora notified, Chaplin on the floor and called family

## 2014-09-25 NOTE — Discharge Summary (Addendum)
Death Summary  Tamara Bailey ZOX:[id number] DOB: Mar 25, 1929 DOA: Sep 06, 2014  PCP: Ginette Otto, MD PCP/Office notified: Notified Dr. Pete Glatter over telephone conversation  Admit date: 09-06-14 Date of Death: September 15, 2014  Final Diagnoses:  Principal Problem:   Sepsis Active Problems:   Hip fracture   CHF exacerbation   HCAP (healthcare-associated pneumonia)   Atrial fibrillation with rapid ventricular response   Chronic anticoagulation   Acute respiratory failure   Hypokalemia   Acute respiratory failure with hypoxia   Sepsis due to pneumonia   Acute systolic CHF (congestive heart failure)   Chronic atrial fibrillation   Right knee pain   Dementia   Other specified hypothyroidism   Dysphagia   Anticoagulated on warfarin     History of present illness:  Tamara Bailey is a 81 y.o. female, with afib on coumadin, dementia and stroke who presents this morning with shortness of breath that started suddenly at 4 AM. Per the EDP report thick white secretions were suctioned from her throat at [facility] home by EMS prior to coming to the ER. During my time with the patient she is working to breathe and is difficult to understand so history was limited. She denies any chest pain. She tells me that she recently had a right hip repair. As a matter fact she was discharged from the hospital on June, 23rd. She is on chronic Coumadin her INR is 3.01. She has a PEG tube in place for dysphagia, she has been receiving PEG tube feedings as well as oral feedings. Lactic acid is elevated 3.48, CBC shows elevated neutrophils. Chest x-ray shows pulmonary edema with bilateral pleural effusions. BNP is 1058.7.  Per her sister Tamara Bailey [phone]) the patient is relatively new to the area having moved from a nursing home in [place]. She has been having difficulty with thick white secretions for a long period of time. She has been becoming progressively weaker over the last several months. She  has been treated for cardiomyopathy that started after her chemotherapy treatment for breast cancer. Tamara Bailey also mentions that she has an active upper respiratory infection and has recently visited her sister.  The patient will be admitted to stepdown for sepsis with congestive heart failure and probable healthcare associated versus aspiration pneumonia.  Hospital Course:  81 year old patient who is a resident of a skilled nursing facility history of atrial fibrillation on Coumadin, dementia, CVA, history of dysphagia on PEG tube feedings presented with shortness of breath secondary to aspiration pneumonia and acute systolic heart failure. Hospital course complicated by development of A. fib with RVR. EKG had revealed atrial fibrillation with right bundle branch block. Although initially improved, unfortunately he developed acute respiratory distress on 7/5 with evidence of pooling secretions. After discussion with multiple family members, we have transitioned to comfort care. She passed away on 15-Sep-2014, pronounced dead at 4:15 pm. Family members were notified.   Sepsis due to Aspiration Pneumonia -Patient having a history of advanced dementia and CVA likely having recurrent aspirations  -She completed IV Abx course during this hospitalization -Went into respiratory failure on 08/29/2014, I suspected she aspirated on her own secretions given her history  Acute respiratory failure - Evidenced by patient going into respiratory distress, having respiratory rate in 40s as reported by nursing staff, likely secondary to aspiration  Acute on Chronic Severe Systolic CHF -She was treated with IV Lasix during this hospitalization  History of dysphagia: -As mentioned above patient having a history of CVA and advanced dementia  -PEG tube  in place and was on bolus feeds. Unfortunately seems to have dramatically worsened on 7/5 with pooling of secretions and tachypnea-high suspicion for aspiration  pneumonia. Transitioned to comfort measures.  Goals of care  -I spoke with patient's brother and sister at bedside regarding goals of care. Explained Tamara Bailey's overall poor prognosis and unlikelihood of obtaining a meaningful recovery at this point. With her history of advanced dementia and CVA she will likely continue to aspirate. Family members expressing desire to focus on comfort and quality of life. They would like for her to go back to her skilled nursing facility with hospice care rather than inpatient hospice.  -Morphine drip was discontinued as she was started on Roxanol, as this would facilitate transition to skilled nursing facility with hospice services. -Initially we had planned on a transition to SNF on 09/16/2014 with hospice services following, however, based on my exam today I thought she was unstable for transfer. She was completely unresponsive, mottled, having agonal breaths.   She passed away at 4:15 pm on 09/07/2014   Time: 20 min  Signed:  Jeralyn BennettZAMORA, Tamara Bailey  [facility] 09/14/2014, 4:39 PM

## 2014-09-25 NOTE — Progress Notes (Signed)
   08/28/2014 1600  Clinical Encounter Type  Visited With Health care provider;Family  Visit Type Death  Spiritual Encounters  Spiritual Needs Grief support  PT died [facility] contact family (sister and daughter) to inform them of the loss, get funeral home info and next of kin information.

## 2014-09-25 DEATH — deceased

## 2015-06-21 IMAGING — CR DG HIP (WITH OR WITHOUT PELVIS) 2-3V*L*
3 series · 3 of 3 positions shown · non-contrast
Comparison: None.

CLINICAL DATA: Left hip pain with no new trauma

EXAM:
LEFT HIP - COMPLETE 2+ VIEW

[x pelvis]
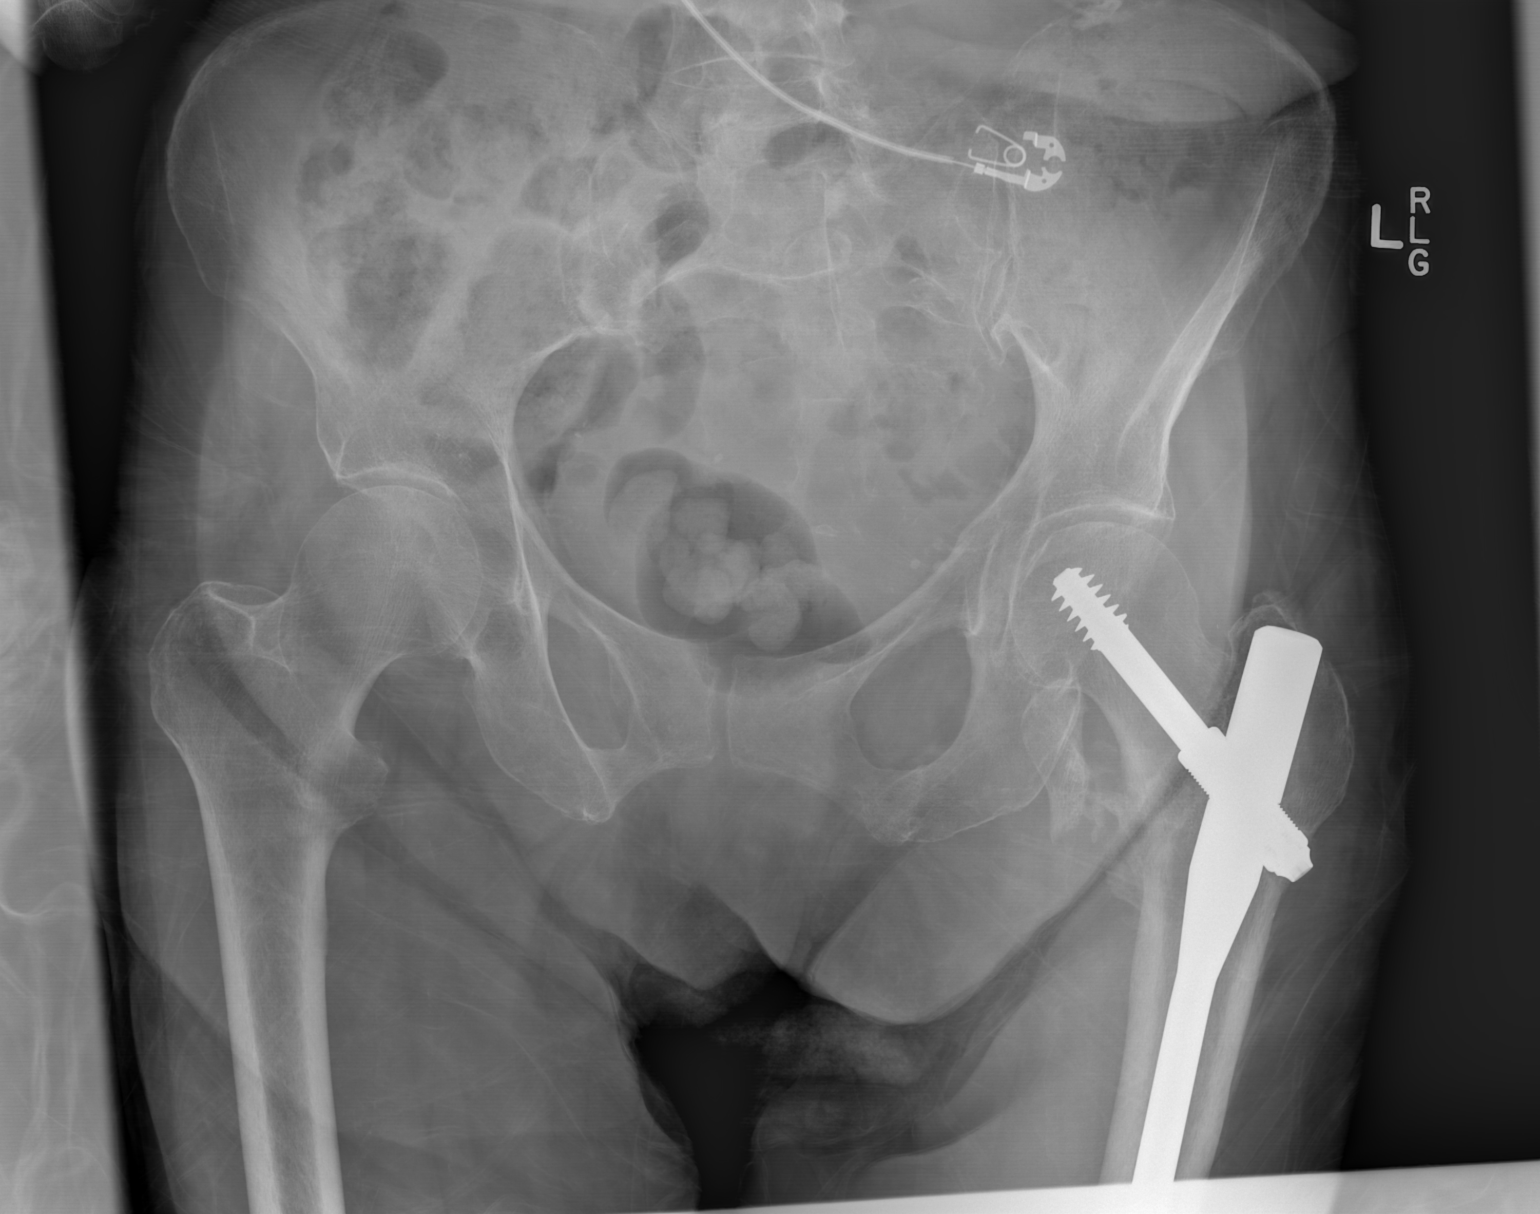

[x hip ap left (1 of 2)]
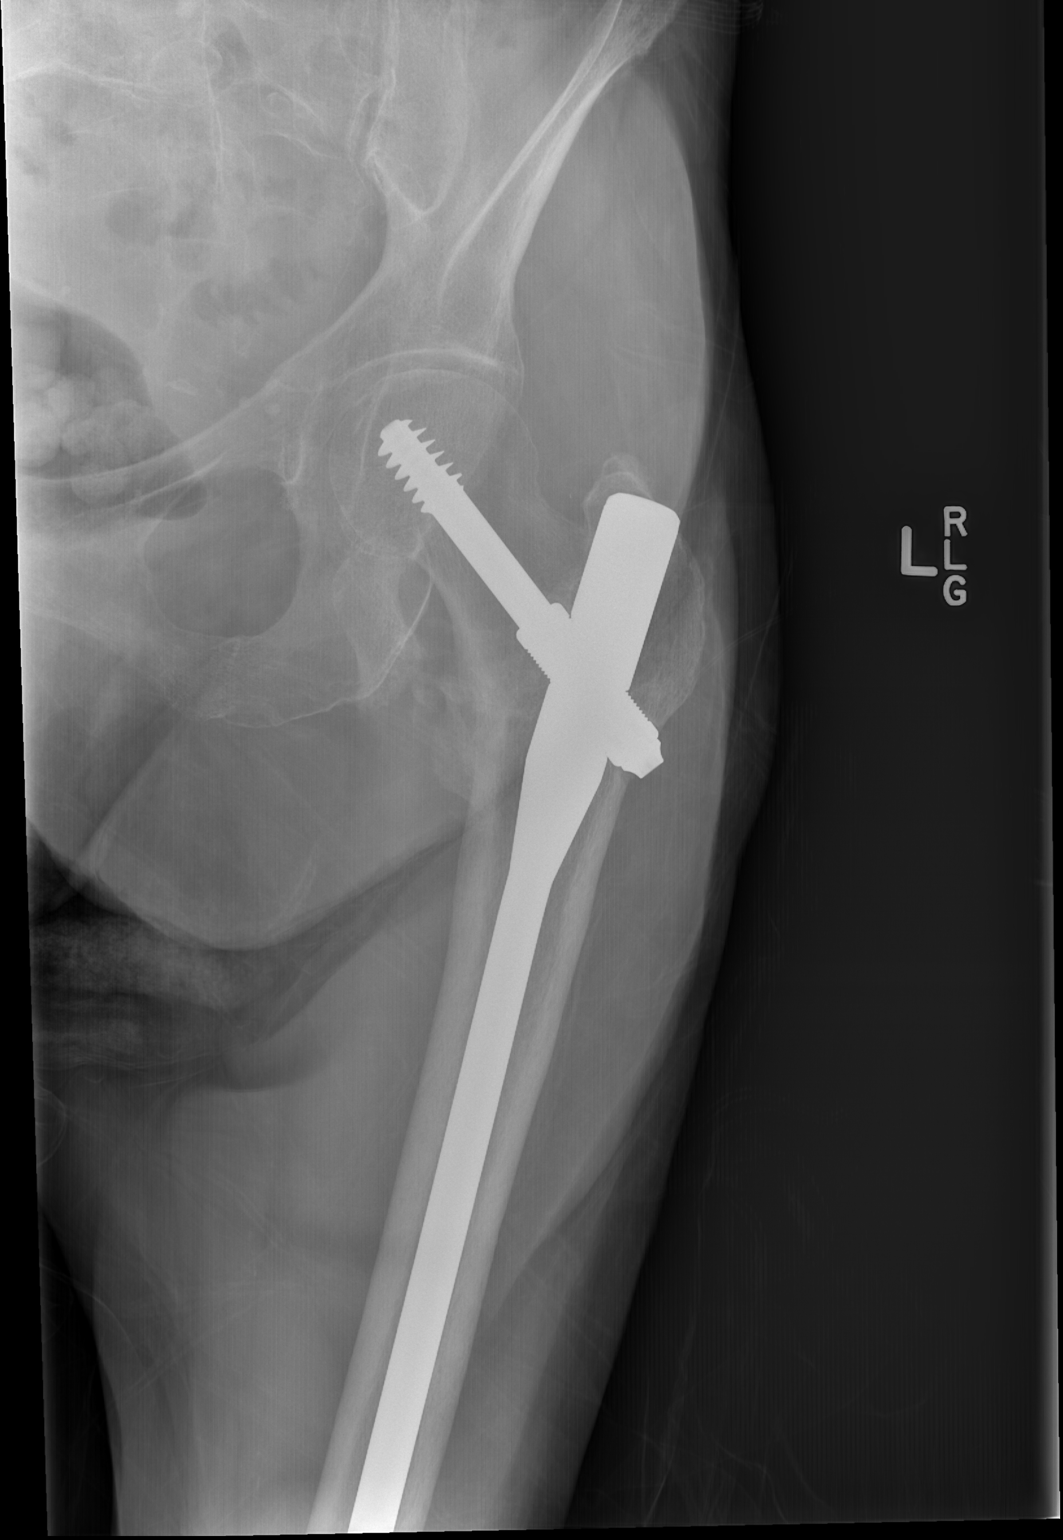

[x hip ap left (2 of 2)]
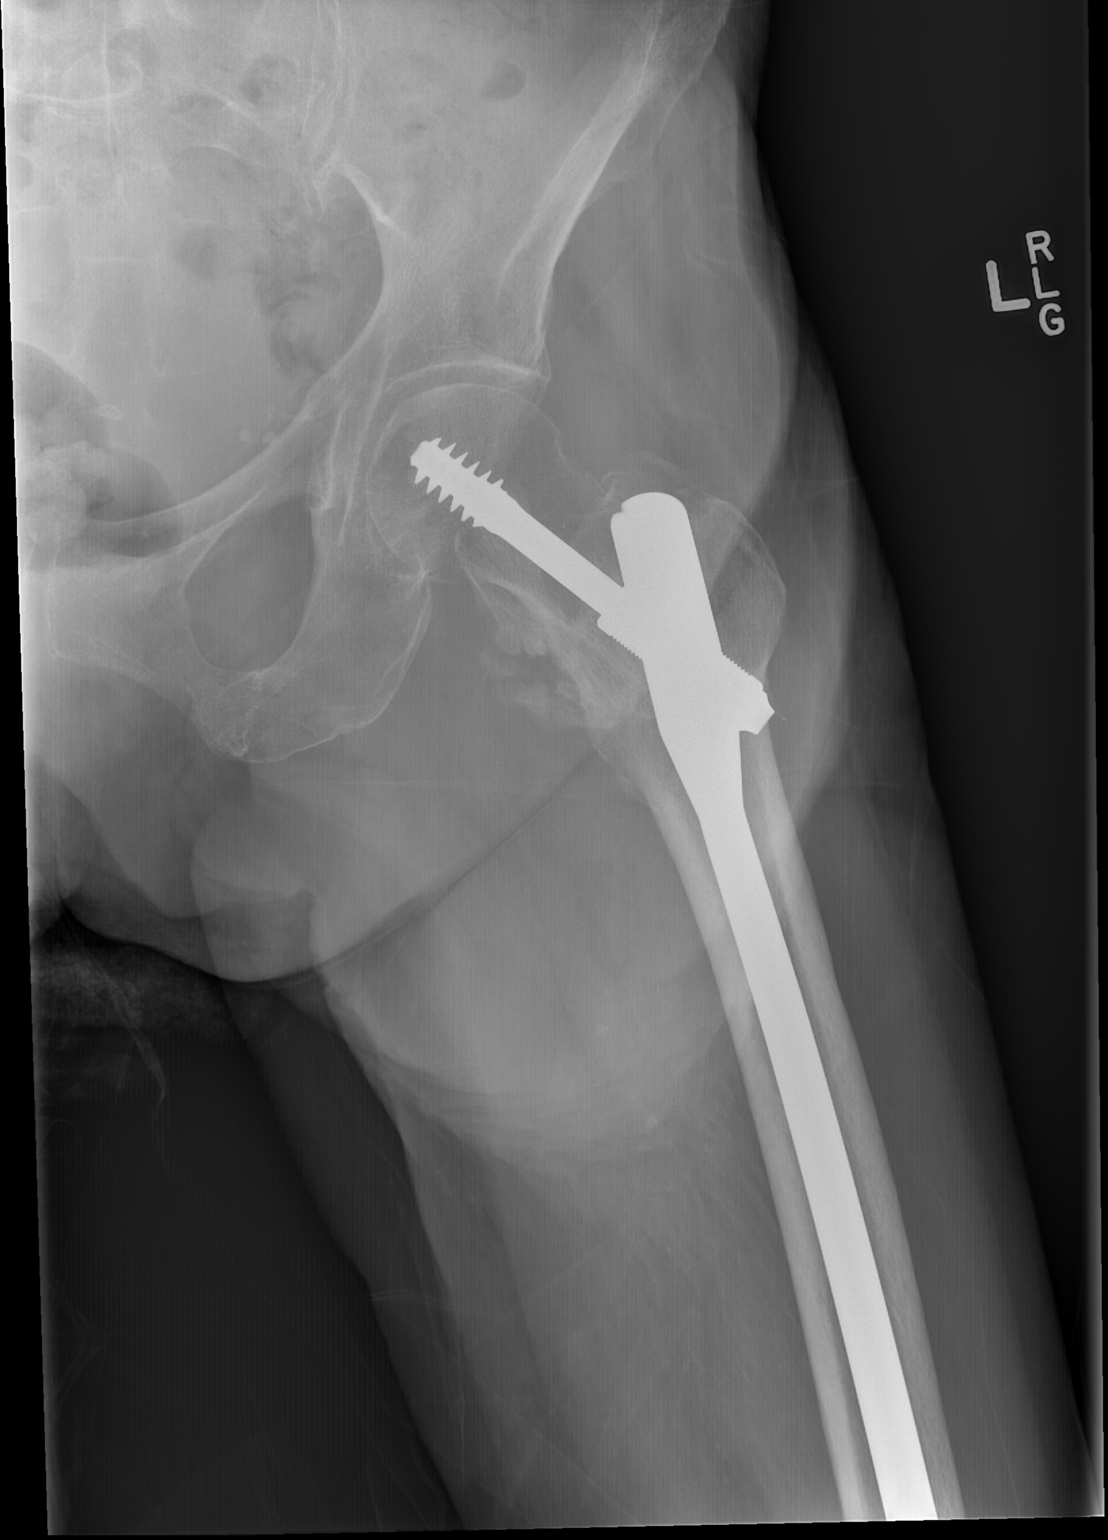

[3 of 3 positions shown; findings below may reference images not displayed]

FINDINGS: Old left intertrochanteric fracture transfixed by an intra medullary
nail and interlocking screw. There is medial superior displacement
of the left lesser trochanter. No hardware failure or complication
of the visualized left hip.

There is no acute fracture or dislocation. There is generalized
osteopenia.
IMPRESSION: No acute osseous injury of the left hip.

## 2015-06-24 IMAGING — XA IR REPLACE G-TUBE/COLONIC TUBE
1 series · 4 of 4 positions shown · IV contrast (omnipaque)
Comparison: none

CLINICAL DATA: 84-year-old female with a chronic indwelling
percutaneous gastrostomy tube. The external portion of the tube has
a small tear and is beaking. She presents for exchange of the tube
under fluoroscopic guidance.

EXAM:
GASTROSTOMY CATHETER REPLACEMENT
Date: 11/14/2013
PROCEDURE:
1. Exchange of balloon retention percutaneous gastrostomy tube
ANESTHESIA/SEDATION:
None required
FLUOROSCOPY TIME:  6 seconds
CONTRAST:  10mL OMNIPAQUE IOHEXOL 300 MG/ML  SOLN
TECHNIQUE: Informed consent was obtained from the patient following explanation
of the procedure, risks, benefits and alternatives. The patient
understands, agrees and consents for the procedure. All questions
were addressed. A time out was performed.

[Series 1: run · 4 of 4 slices shown]
[im 1/4]
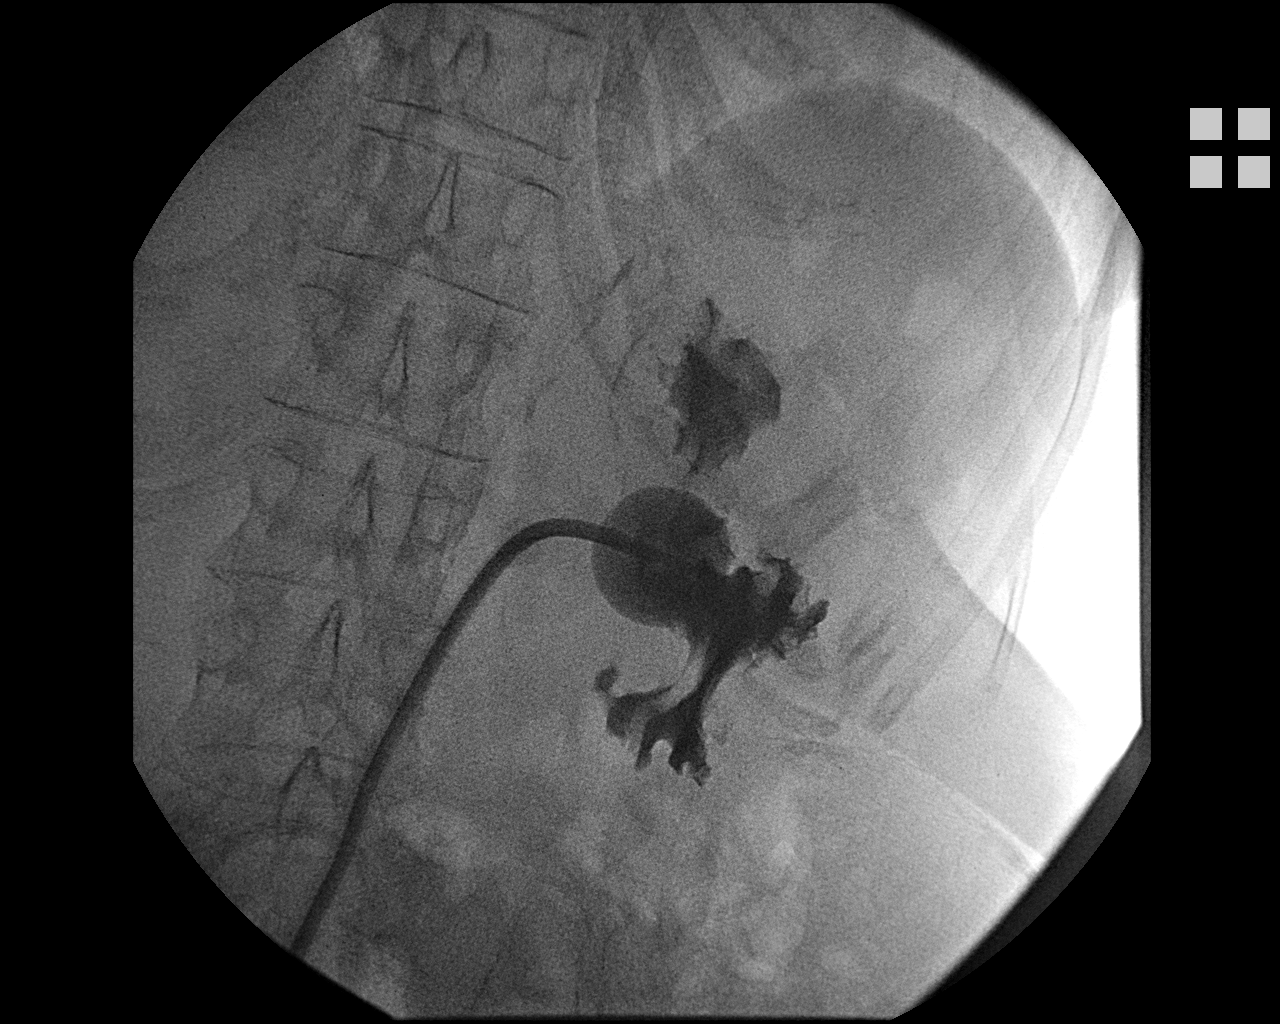
[im 2/4]
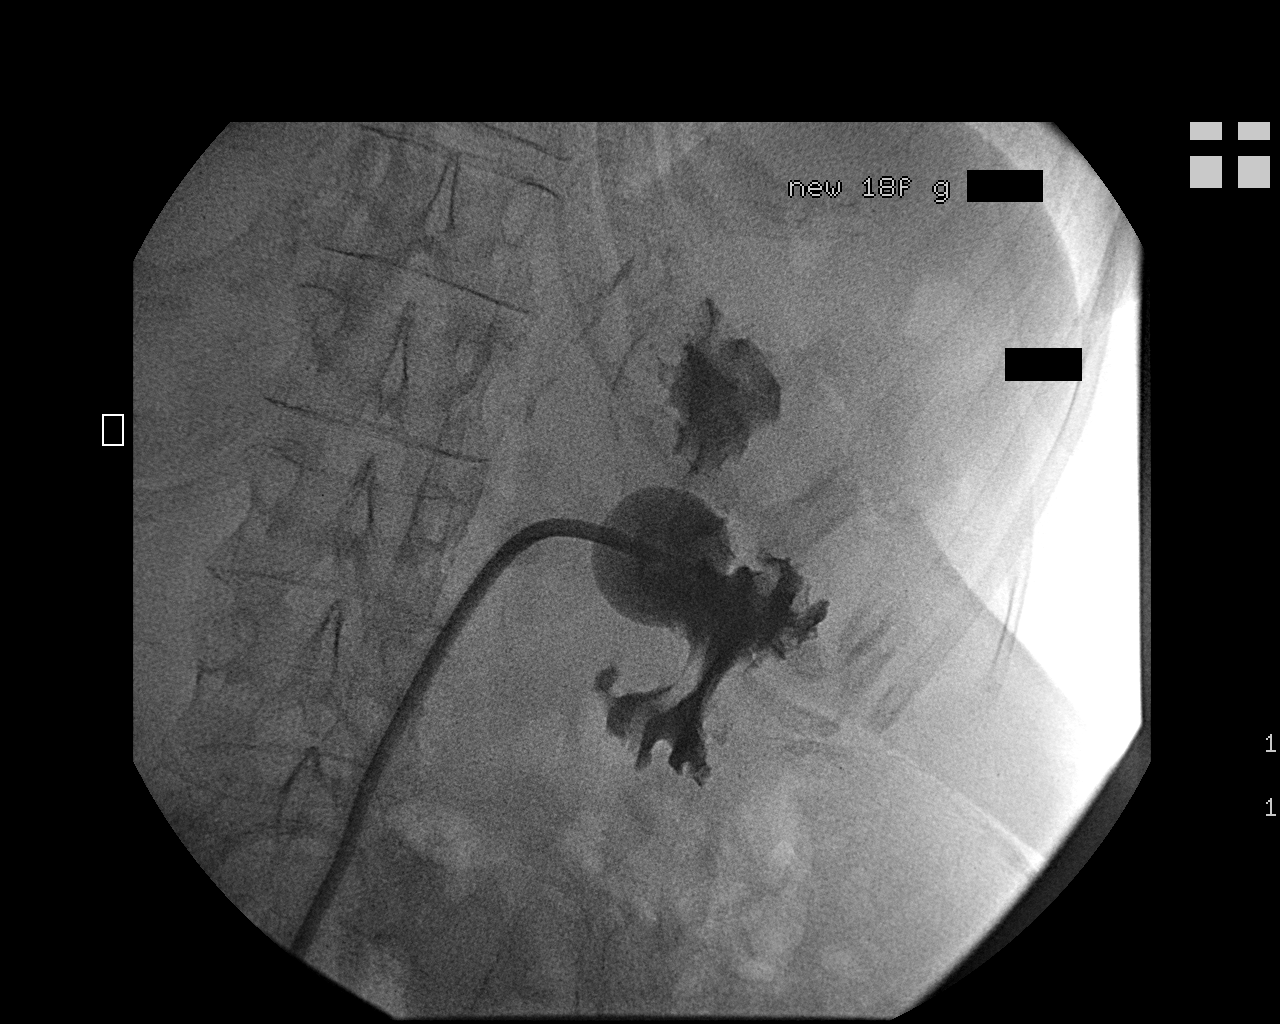
[im 3/4]
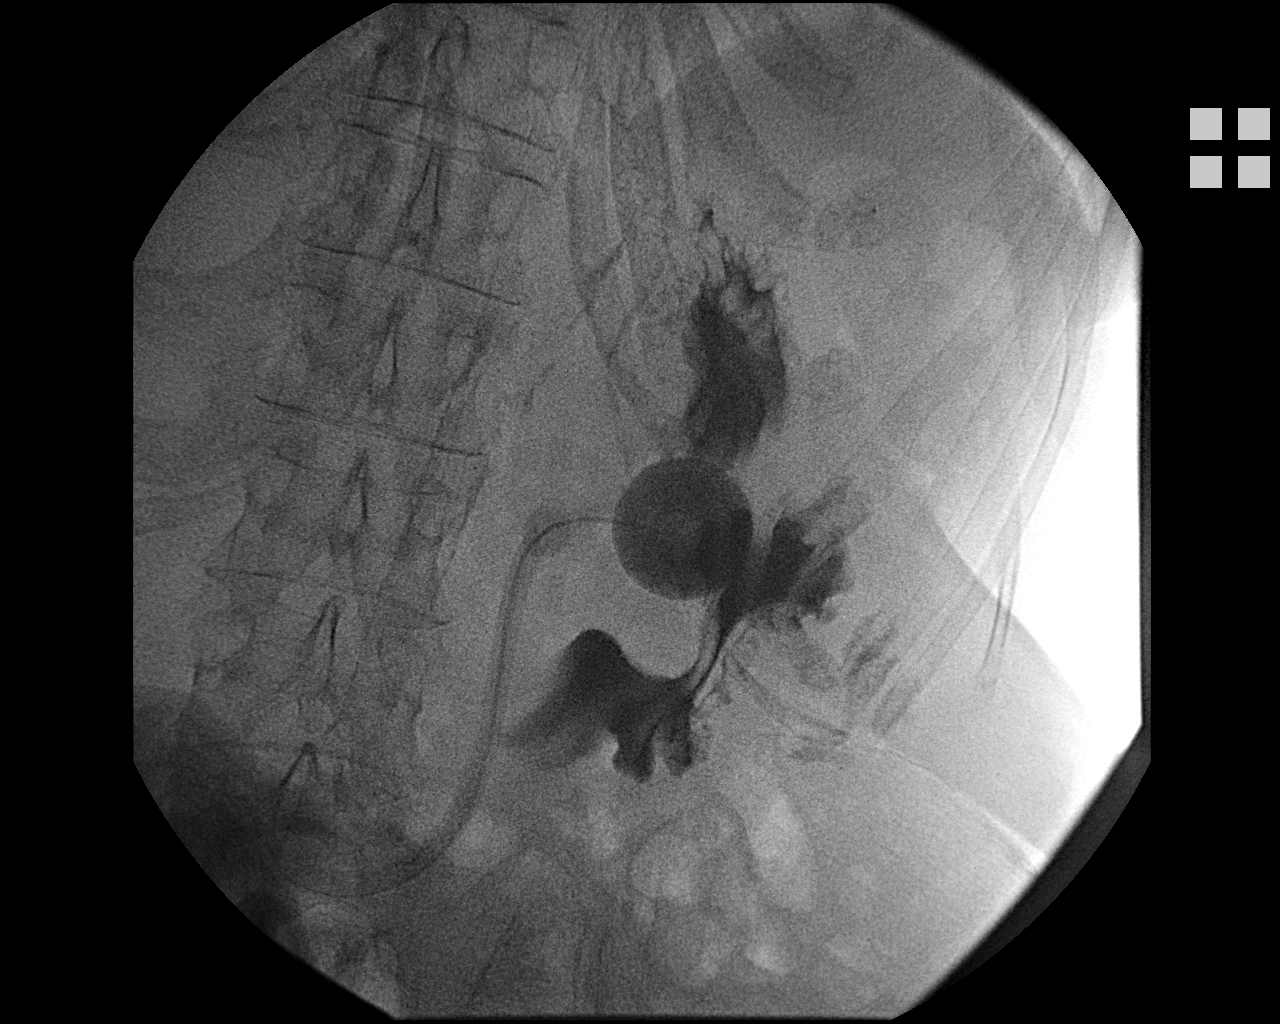
[im 4/4]
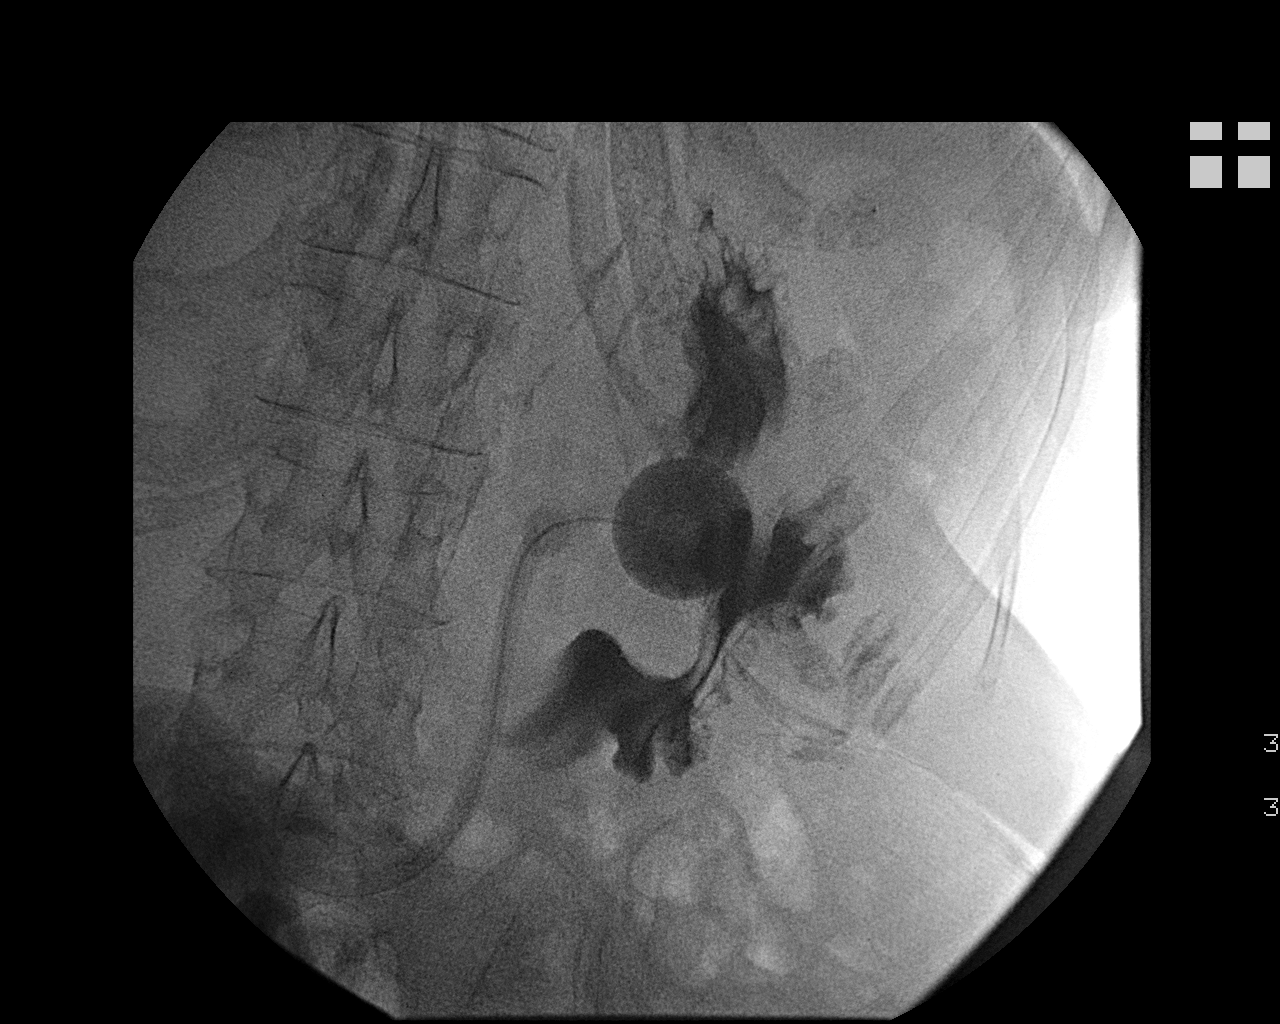

[4 of 4 positions shown; findings below may reference images not displayed]

The retention balloon of the existing tube was deflated and the tube
removed. A new 18 French balloon retention percutaneous gastrostomy
tube was then advanced through the well formed tract and into the
stomach. The retention balloon was inflated and pulled stenotic and
the anterior abdominal wall. Injection of contrast material under
fluoroscopy confirmed intragastric placement. The external bumper
was secured to the anterior abdominal wall. The patient tolerated
the procedure well.

COMPLICATIONS:
None
IMPRESSION: Successful exchange for a new 18 French balloon retention
percutaneous gastrostomy tube.
